# Patient Record
Sex: Female | Born: 1970 | Race: White | Hispanic: No | Marital: Single | Smoking: Current every day smoker
Health system: Southern US, Community
[De-identification: ages and names within clinical notes are randomized; demographics above are authoritative.]

## PROBLEM LIST (undated history)

## (undated) DIAGNOSIS — I10 Essential (primary) hypertension: Secondary | ICD-10-CM

## (undated) DIAGNOSIS — G43909 Migraine, unspecified, not intractable, without status migrainosus: Secondary | ICD-10-CM

## (undated) DIAGNOSIS — R51 Headache: Secondary | ICD-10-CM

## (undated) DIAGNOSIS — R519 Headache, unspecified: Secondary | ICD-10-CM

## (undated) DIAGNOSIS — IMO0001 Reserved for inherently not codable concepts without codable children: Secondary | ICD-10-CM

## (undated) DIAGNOSIS — K000 Anodontia: Secondary | ICD-10-CM

## (undated) DIAGNOSIS — K08109 Complete loss of teeth, unspecified cause, unspecified class: Secondary | ICD-10-CM

## (undated) DIAGNOSIS — D649 Anemia, unspecified: Secondary | ICD-10-CM

## (undated) HISTORY — PX: HYSTERECTOMY: SHX81

## (undated) HISTORY — PX: TUBAL LIGATION: SHX77

## (undated) HISTORY — PX: HX HYSTERECTOMY: SHX81

## (undated) HISTORY — DX: Essential (primary) hypertension: I10

---

## 1992-05-18 ENCOUNTER — Emergency Department: Admit: 1992-05-18 | Disposition: A | Discharge status: Home / Self Care | Payer: Self-pay | Source: Ambulatory Visit

## 1999-12-11 ENCOUNTER — Emergency Department (HOSPITAL_COMMUNITY): Admission: EM | Admit: 1999-12-11 | Discharge: 1999-12-11 | Payer: Self-pay | Admitting: Emergency Medicine

## 1999-12-11 ENCOUNTER — Encounter: Payer: Self-pay | Admitting: Emergency Medicine

## 2000-06-04 ENCOUNTER — Encounter: Payer: Self-pay | Admitting: *Deleted

## 2000-06-04 ENCOUNTER — Emergency Department (HOSPITAL_COMMUNITY): Admission: EM | Admit: 2000-06-04 | Discharge: 2000-06-04 | Payer: Self-pay | Admitting: Emergency Medicine

## 2004-01-13 ENCOUNTER — Emergency Department (HOSPITAL_COMMUNITY): Admission: EM | Admit: 2004-01-13 | Discharge: 2004-01-13 | Payer: Self-pay | Admitting: Emergency Medicine

## 2006-12-24 ENCOUNTER — Ambulatory Visit: Payer: Self-pay | Admitting: Sports Medicine

## 2006-12-24 ENCOUNTER — Encounter (INDEPENDENT_AMBULATORY_CARE_PROVIDER_SITE_OTHER): Payer: Self-pay | Admitting: Family Medicine

## 2006-12-24 LAB — CONVERTED CEMR LAB
ALT: <8 units/L (ref 0–35)
AST: 10 units/L (ref 0–37)
Albumin: 4.9 g/dL (ref 3.5–5.2)
Alkaline Phosphatase: 57 units/L (ref 39–117)
BUN: 11 mg/dL (ref 6–23)
CO2: 26 meq/L (ref 19–32)
Calcium: 9.5 mg/dL (ref 8.4–10.5)
Chloride: 104 meq/L (ref 96–112)
Cholesterol: 175 mg/dL (ref 0–200)
Creatinine, Ser: 0.83 mg/dL (ref 0.40–1.20)
Glucose, Bld: 84 mg/dL (ref 70–99)
HCT: 37 % (ref 36.0–46.0)
HDL: 35 mg/dL — ABNORMAL LOW (ref >39)
Hemoglobin: 11.3 g/dL — ABNORMAL LOW (ref 12.0–15.0)
LDL Cholesterol: 118 mg/dL — ABNORMAL HIGH (ref 0–99)
MCHC: 30.5 g/dL (ref 30.0–36.0)
MCV: 79.1 fL (ref 78.0–100.0)
Platelets: 322 10*3/uL (ref 150–400)
Potassium: 3.8 meq/L (ref 3.5–5.3)
RBC: 4.68 M/uL (ref 3.87–5.11)
RDW: 16.6 % — ABNORMAL HIGH (ref 11.5–14.0)
Sodium: 140 meq/L (ref 135–145)
TSH: 2.434 microintl units/mL (ref 0.350–5.50)
Total Bilirubin: 0.3 mg/dL (ref 0.3–1.2)
Total CHOL/HDL Ratio: 5
Total Protein: 7.8 g/dL (ref 6.0–8.3)
Triglycerides: 110 mg/dL (ref <150)
VLDL: 22 mg/dL (ref 0–40)
WBC: 8.8 10*3/uL (ref 4.0–10.5)

## 2007-02-11 DIAGNOSIS — I10 Essential (primary) hypertension: Secondary | ICD-10-CM | POA: Insufficient documentation

## 2007-02-11 DIAGNOSIS — G43909 Migraine, unspecified, not intractable, without status migrainosus: Secondary | ICD-10-CM | POA: Insufficient documentation

## 2007-02-11 DIAGNOSIS — F329 Major depressive disorder, single episode, unspecified: Secondary | ICD-10-CM

## 2008-07-19 ENCOUNTER — Ambulatory Visit: Payer: Self-pay | Admitting: Family Medicine

## 2008-08-01 ENCOUNTER — Encounter (INDEPENDENT_AMBULATORY_CARE_PROVIDER_SITE_OTHER): Payer: Self-pay | Admitting: Family Medicine

## 2008-08-01 ENCOUNTER — Ambulatory Visit: Payer: Self-pay | Admitting: Family Medicine

## 2008-08-01 DIAGNOSIS — N920 Excessive and frequent menstruation with regular cycle: Secondary | ICD-10-CM | POA: Insufficient documentation

## 2008-08-01 DIAGNOSIS — E663 Overweight: Secondary | ICD-10-CM | POA: Insufficient documentation

## 2008-08-02 ENCOUNTER — Ambulatory Visit (HOSPITAL_COMMUNITY): Admission: RE | Admit: 2008-08-02 | Discharge: 2008-08-02 | Payer: Self-pay | Admitting: Family Medicine

## 2008-08-03 ENCOUNTER — Encounter (INDEPENDENT_AMBULATORY_CARE_PROVIDER_SITE_OTHER): Payer: Self-pay | Admitting: Family Medicine

## 2008-08-03 LAB — CONVERTED CEMR LAB
ALT: <8 units/L (ref 0–35)
AST: 12 units/L (ref 0–37)
Albumin: 4.5 g/dL (ref 3.5–5.2)
Alkaline Phosphatase: 59 units/L (ref 39–117)
BUN: 7 mg/dL (ref 6–23)
CO2: 20 meq/L (ref 19–32)
Calcium: 9.2 mg/dL (ref 8.4–10.5)
Chloride: 107 meq/L (ref 96–112)
Cholesterol: 160 mg/dL (ref 0–200)
Creatinine, Ser: 0.69 mg/dL (ref 0.40–1.20)
Glucose, Bld: 92 mg/dL (ref 70–99)
HCT: 36.7 % (ref 36.0–46.0)
HDL: 36 mg/dL — ABNORMAL LOW (ref >39)
Hemoglobin: 11 g/dL — ABNORMAL LOW (ref 12.0–15.0)
LDL Cholesterol: 99 mg/dL (ref 0–99)
MCHC: 30 g/dL (ref 30.0–36.0)
MCV: 76.9 fL — ABNORMAL LOW (ref 78.0–100.0)
Platelets: 317 10*3/uL (ref 150–400)
Potassium: 4.3 meq/L (ref 3.5–5.3)
RBC: 4.77 M/uL (ref 3.87–5.11)
RDW: 17.9 % — ABNORMAL HIGH (ref 11.5–15.5)
Sodium: 140 meq/L (ref 135–145)
TSH: 2.054 microintl units/mL (ref 0.350–4.50)
Total Bilirubin: 0.3 mg/dL (ref 0.3–1.2)
Total CHOL/HDL Ratio: 4.4
Total Protein: 7.3 g/dL (ref 6.0–8.3)
Triglycerides: 127 mg/dL (ref <150)
VLDL: 25 mg/dL (ref 0–40)
WBC: 7.6 10*3/uL (ref 4.0–10.5)

## 2008-08-24 ENCOUNTER — Encounter: Payer: Self-pay | Admitting: *Deleted

## 2009-04-20 ENCOUNTER — Telehealth (INDEPENDENT_AMBULATORY_CARE_PROVIDER_SITE_OTHER): Payer: Self-pay | Admitting: Family Medicine

## 2009-04-20 ENCOUNTER — Ambulatory Visit: Payer: Self-pay | Admitting: Family Medicine

## 2009-04-20 ENCOUNTER — Encounter: Payer: Self-pay | Admitting: Family Medicine

## 2009-04-20 LAB — CONVERTED CEMR LAB
Beta hcg, urine, semiquantitative: NEGATIVE
Bilirubin Urine: NEGATIVE
Chlamydia, DNA Probe: NEGATIVE
Epithelial cells, urine: >20 /lpf
GC Probe Amp, Genital: NEGATIVE
Glucose, Urine, Semiquant: NEGATIVE
Nitrite: NEGATIVE
Protein, U semiquant: 30
Specific Gravity, Urine: >=1.03
Urobilinogen, UA: 0.2
pH: 5.5

## 2009-04-21 ENCOUNTER — Encounter: Payer: Self-pay | Admitting: Family Medicine

## 2009-04-23 ENCOUNTER — Ambulatory Visit: Payer: Self-pay | Admitting: Family Medicine

## 2009-09-22 ENCOUNTER — Encounter (INDEPENDENT_AMBULATORY_CARE_PROVIDER_SITE_OTHER): Payer: Self-pay | Admitting: *Deleted

## 2009-09-22 DIAGNOSIS — F17200 Nicotine dependence, unspecified, uncomplicated: Secondary | ICD-10-CM | POA: Insufficient documentation

## 2010-04-04 ENCOUNTER — Ambulatory Visit: Payer: Self-pay | Admitting: Family Medicine

## 2010-08-14 ENCOUNTER — Ambulatory Visit: Payer: Self-pay | Admitting: Family Medicine

## 2010-08-15 ENCOUNTER — Telehealth: Payer: Self-pay | Admitting: Family Medicine

## 2010-10-25 ENCOUNTER — Ambulatory Visit: Payer: Self-pay | Admitting: Family Medicine

## 2010-10-25 DIAGNOSIS — R000 Tachycardia, unspecified: Secondary | ICD-10-CM

## 2010-10-25 DIAGNOSIS — R0602 Shortness of breath: Secondary | ICD-10-CM | POA: Insufficient documentation

## 2011-01-16 NOTE — Assessment & Plan Note (Signed)
Summary: poison ivy,df   Vital Signs:  Patient profile:   40 year old female Height:      68.5 inches Weight:      178 pounds BMI:     26.77 Temp:     98.7 degrees F oral Pulse rate:   80 / minute BP sitting:   169 / 108  (left arm) Cuff size:   regular  Vitals Entered By: Garen Grams LPN (August 14, 2010 4:05 PM) CC: poison ivy x 4 days Is Patient Diabetic? No Pain Assessment Patient in pain? no        Primary Care Provider:  Lupita Raider MD  CC:  poison ivy x 4 days.  History of Present Illness: Patient seen today as acute visit, along with [facility] medical student Darra Lis.  She presents today with complaint of itchy rash that developed in the past 3 days.  Was working in yard with heavy poison ivy overgrowth since Aug 26th.  Itching started on L leg/arm, has since spread to her R side as well.  Can't stand it, making her miserable.  No fevers or chills, has had poor appetite.   Has anxiety at baseline, and this itch is aggravating even more.   Also complains of R ankle pain, since tripping over cinderblock on July 4th.  Was able to walk after the fall.  Since then has had significant pain when she touches it.  Can move ankle, but is concerned about the fact that this is not improving at almost 2 months since the injury.    Habits & Providers  Alcohol-Tobacco-Diet     Tobacco Status: current     Tobacco Counseling: to quit use of tobacco products  Allergies (verified): No Known Drug Allergies  Physical Exam  General:  Generally well appearing, no apparent distress Extremities:  Tattoo over L lateral ankle.  Underlying mild erythema without evidence of break in skin.  Full active and passive ROM, no pain to palpate malleoli.  Mild calor at site of erythema. Palpable dp pulses in both feet. Mild lateral L ankle edema noted.   R ankle with point tenderness and hyperpigmentation along medial ankle just proximal to the R medial malleolus.  Full active and passive  ROM. Palpable dp pulse.  Sensation in toes in both feet full and grossly intact.  Walks without limitation.  Skin:  Several patches of linear raised vesicular lesions, some excoriated and in varying stages of progression.  Located along L leg, R leg/thigh, L arm.     Impression & Recommendations:  Problem # 1:  POISON IVY DERMATITIS (ICD-692.6) Patient is quite miserable with the poison ivy dermatitis.  Cannot tolerate calamine lotion (makes her "break out").  Appropriate for Depo Medrol, plus oral prednisone over seven days.  May consider prolonging course if rebound pruritus.  Antihistamine for itch.  Clothes that were worn at time of contact are soaking. Her updated medication list for this problem includes:    Claritin 10 Mg Tabs (Loratadine) .Marland Kitchen... 1 tablet by mouth daily for 2 weeks    Prednisone 20 Mg Tabs (Prednisone) ..... Sig: take 2 tablets by mouth one time daily for 7 days.  Orders: Depo- Medrol 40mg  (J1030) [facility]- Est Level  3 ([address]109)  Problem # 2:  ANKLE PAIN, RIGHT (ICD-719.47) R ankle pain for nearly two months, since sustaining trauma on July 4th.  Notable point tenderness, although she is able to bear weight and ambulate without limitation, and has been able to do so  since the injury by report today.   Will image with x-ray R ankle/distal tibia in light of duration of pain and point tenderness.   Orders: Diagnostic X-Ray/Fluoroscopy (Diagnostic X-Ray/Flu) [facility]- Est Level  3 (95621)  Complete Medication List: 1)  Midrin 325-65-100 Mg Caps (Apap-isometheptene-dichloral) .... 2 capsules at onset of headache then 1 capsule every 4 hours as needed thereafter 2)  Claritin 10 Mg Tabs (Loratadine) .Marland Kitchen.. 1 tablet by mouth daily for 2 weeks 3)  Metoprolol Tartrate 25 Mg Tabs (Metoprolol tartrate) .Marland Kitchen.. 1 tab by mouth two times a day for high blood pressure 4)  Meloxicam 15 Mg Tabs (Meloxicam) .... Take one tablet daily for pain 5)  Prednisone 20 Mg Tabs (Prednisone) .... Sig: take 2  tablets by mouth one time daily for 7 days.  Patient Instructions: 1)  It was a pleasure to see you today.  We are giving you an injection of a steroid (Depo Medrol) for your intense itching from the poison ivy. 2)  You may take prednisone by mouth (40mg  one time daily) for the coming seven days.  The prescription was sent to your [facility] at [facility] and [address].  3)  You may also take Claritin (loratadine) 10mg  one time daily for the itch.  4)  We are ordering an x-ray of your R ankle, because of the duration of time you have had the pain.  5)  Please follow up with your primary doctor. Prescriptions: PREDNISONE 20 MG TABS (PREDNISONE) SIG: take 2 tablets by mouth one time daily for 7 days.  #14 x 0   Entered and Authorized by:   Paula Compton MD   Signed by:   Paula Compton MD on 08/14/2010   Method used:   Electronically to        [facility] #[id number]* (retail)       [address] [place], [place]  [address]       Ph: [phone]       Fax: [phone]   RxID:   [id number]    Medication Administration  Injection # 1:    Medication: Depo- Medrol 40mg     Diagnosis: POISON IVY DERMATITIS (ICD-692.6)    Route: IM    Site: RUOQ gluteus    Exp Date: 08/16/2011    Lot #: [id number]    Patient tolerated injection without complications    Given by: Garen Grams LPN (August 14, 2010 4:33 PM)  Orders Added: 1)  Diagnostic X-Ray/Fluoroscopy [Diagnostic X-Ray/Flu] 2)  Depo- Medrol 40mg  [J1030] 3)  [facility]- Est Level  3 [75643]

## 2011-01-16 NOTE — Assessment & Plan Note (Signed)
Summary: fall on  buttocks,tcb   Vital Signs:  Patient profile:   40 year old female Height:      68.5 inches Weight:      187 pounds BMI:     28.12 BSA:     2.00 Temp:     98.7 degrees F Pulse rate:   76 / minute BP sitting:   198 / 141  Vitals Entered By: Jone Baseman CMA (April 04, 2010 9:15 AM) CC: fall this am Pain Assessment Patient in pain? yes     Location: right hip Intensity: 10   Primary Care Provider:  Lupita Raider MD  CC:  fall this am.  History of Present Illness: 40 yo seen for work-in apt for fall at 3:30 this am  Was getting up to go to bathroom and fell down 4 steps on her right buttocks area.  has been very sore and painful this morning.  No LOC, no other part of body hurt.  Mechanical fall.  Habits & Providers  Alcohol-Tobacco-Diet     Tobacco Status: current     Tobacco Counseling: to quit use of tobacco products     Cigarette Packs/Day: 1.5  Current Medications (verified): 1)  Midrin 325-65-100 Mg Caps (Apap-Isometheptene-Dichloral) .... 2 Capsules At Onset of Headache Then 1 Capsule Every 4 Hours As Needed Thereafter 2)  Claritin 10 Mg  Tabs (Loratadine) .Marland Kitchen.. 1 Tablet By Mouth Daily For 2 Weeks 3)  Metoprolol Tartrate 25 Mg Tabs (Metoprolol Tartrate) .Marland Kitchen.. 1 Tab By Mouth Two Times A Day For High Blood Pressure 4)  Meloxicam 15 Mg Tabs (Meloxicam) .... Take One Tablet Daily For Pain  Allergies (verified): No Known Drug Allergies  Social History: Packs/Day:  1.5  Review of Systems      See HPI MS:  Complains of muscle aches. Heme:  Denies abnormal bruising and bleeding.  Physical Exam  General:  Alert and oriented, pleasant, appears older than age. Msk:  right buttocks with approx 8 x 8 cm bruise with no erythema or break in skin.  Tender in this area.  No pain over lower back or over coccyx.   Impression & Recommendations:  Problem # 1:  HIP PAIN, RIGHT (ICD-719.45)  due to buttocks bruise s/p fall.  No evidence of coccyx  fracture.  WIll give high dose NSAIDS + Tylenol for pain.  Advised to return if not improving.  Given letter to be out of work for this weekend and return on Monday 04/08/10.  Her updated medication list for this problem includes:    Meloxicam 15 Mg Tabs (Meloxicam) .Marland Kitchen... Take one tablet daily for pain  Orders: [facility]- Est Level  3 (45409)  Problem # 2:  HYPERTENSION, BENIGN SYSTEMIC (ICD-401.1)  Markedly hypertensive today.  Patient not taking any meds.  In the past she has tried norvasc which was too expensive, hctz/lisinopril which made her sick (lightheaded, nauseated).  She does not think she ever filled themetoprolol that was prescribed last time. Will refill this as it may also help her headaches.  Advised follow-up with PCP.  Her updated medication list for this problem includes:    Metoprolol Tartrate 25 Mg Tabs (Metoprolol tartrate) .Marland Kitchen... 1 tab by mouth two times a day for high blood pressure  BP today: 198/141 Prior BP: 138/94 (04/23/2009)  Labs Reviewed: K+: 4.3 (08/01/2008) Creat: : 0.69 (08/01/2008)   Chol: 160 (08/01/2008)   HDL: 36 (08/01/2008)   LDL: 99 (08/01/2008)   TG: 127 (08/01/2008)  Orders: [facility]- Est Level  3 (99213)  Complete Medication List: 1)  Midrin 325-65-100 Mg Caps (Apap-isometheptene-dichloral) .... 2 capsules at onset of headache then 1 capsule every 4 hours as needed thereafter 2)  Claritin 10 Mg Tabs (Loratadine) .Marland Kitchen.. 1 tablet by mouth daily for 2 weeks 3)  Metoprolol Tartrate 25 Mg Tabs (Metoprolol tartrate) .Marland Kitchen.. 1 tab by mouth two times a day for high blood pressure 4)  Meloxicam 15 Mg Tabs (Meloxicam) .... Take one tablet daily for pain  Patient Instructions: 1)  Return for recheck if your pain is not improving. 2)  Make apopintment to see your primary MD to work on your blood pressure and other concerns. Prescriptions: MELOXICAM 15 MG TABS (MELOXICAM) take one tablet daily for pain  #14 x 0   Entered and Authorized by:   Delbert Harness MD   Signed  by:   Delbert Harness MD on 04/04/2010   Method used:   Electronically to        [facility] #[id number]* (retail)       [address] [place], [place]  [address]       Ph: [phone]       Fax: [phone]   RxID:   [id number] METOPROLOL TARTRATE 25 MG TABS (METOPROLOL TARTRATE) 1 tab by mouth two times a day for high blood pressure  #60 x 1   Entered and Authorized by:   Delbert Harness MD   Signed by:   Delbert Harness MD on 04/04/2010   Method used:   Electronically to        [facility] [address] #[id number]* (retail)       [address] [place], [place]  [address]       Ph: [phone]       Fax: [phone]   RxID:   [id number]    Prevention & Chronic Care Immunizations   Influenza vaccine: Not documented    Tetanus booster: Not documented    Pneumococcal vaccine: Not documented  Other Screening   Pap smear: Not documented   Smoking status: current  (04/04/2010)   Smoking cessation counseling: yes  (08/01/2008)  Lipids   Total Cholesterol: 160  (08/01/2008)   LDL: 99  (08/01/2008)   LDL Direct: Not documented   HDL: 36  (08/01/2008)   Triglycerides: 127  (08/01/2008)  Hypertension   Last Blood Pressure: 198 / 141  (04/04/2010)   Serum creatinine: 0.69  (08/01/2008)   Serum potassium 4.3  (08/01/2008)    Hypertension flowsheet reviewed?: Yes   Progress toward BP goal: Deteriorated  Self-Management Support :    Hypertension self-management support: Not documented

## 2011-01-16 NOTE — Progress Notes (Signed)
   Called patient at [phone] to inquire about patient welfare; she reports that she continues to itch but it is better than yesterday.  The R ankle is still painful to touch, plans to go for x-ray tomorrow (Sept 2nd).  Will call her back with result when available.  Paula Compton MD  August 15, 2010 8:52 AM

## 2011-01-16 NOTE — Assessment & Plan Note (Signed)
Summary: SOB,df     Vital Signs:  Patient profile:   40 year old female Height:      68.5 inches Weight:      180.6 pounds BMI:     27.16 O2 Sat:      100 % on Room air Temp:     98.2 degrees F oral Pulse rate:   69 / minute BP sitting:   162 / 104  (left arm) Cuff size:   regular  Vitals Entered By: Garen Grams LPN (October 25, 2010 9:47 AM)  O2 Flow:  Room air CC: SOB x 1-2 years Is Patient Diabetic? No Pain Assessment Patient in pain? no        Primary Care Provider:  . [facility]  CC:  SOB x 1-2 years.  History of Present Illness: 1) Dyspnea, fast heartbeat: Patient reports dyspnea on exertion and at rest for at least the past 2 years, worse over past few months. Reports dyspnea with climbing flight of stairs, but also reports dyspnea while sitting down and while sleeping. Also reports sensation of "heart beating fast" - but does not skip beats - once or twice a day for past year. Unable to say how long episodes last. Reports chest tightness associated with these episodes as well as "thumping in her neck". Reports some dizziness when these episodes occur but denies syncope. Reports generalized weakness with these episodes. Reports poor sleep, increased stress over past few months, "feels cold all the time". Denies leg swelling, substernal chest pain, radiating chest pain, nausea, emesis, diaphoresis.   2) Tobacco use: 1-2 packs per day. Wants to quit. Has tried patches and gum without success. Interested in cessation class. Smokes when stressed. Wants to try Chantix. Reports chronic cough with clear / grey sputum unchanged from baseline with regards to volume or frequency.   3) Medication review: Has not been seen for follow up of chronic issues since May 2010. Has not taken any medications or asked for refills in past year at least.   Habits & Providers  Alcohol-Tobacco-Diet     Tobacco Status: current     Tobacco Counseling: to quit use of tobacco products  Cigarette Packs/Day: 1.5  Current Medications (verified): 1)  None  Allergies (verified): No Known Drug Allergies  Family History: Reviewed history from 02/11/2007 and no changes required. Father with multiple MI in 94s, DM, HTN, HLD, Grandfather with B BKA 2/2 PVD/DM, Mother with multiple TIAs in 25s, Son with some type of familiar hyperlipidemia (TGs of 2300)  Physical Exam  General:  Generally well appearing, no apparent distress, no phrase dyspnea - though she reports "I can't breathe right now" - reviewed vitals  Mouth:  moist membranes, no erythema or exudate, no oral lesions or masses Neck:  no thyromegaly or JVD  Lungs:  Normal respiratory effort, chest expands symmetrically. Lungs are clear to auscultation, no crackles or wheezes. Heart:  Normal rate and regular rhythm. S1 and S2 normal without gallop, murmur, click, rub or other extra sounds. Abdomen:  soft, non-tender, normal bowel sounds, and no masses.   Pulses:  2+ radials regular rate  Extremities:  no edema  Neurologic:  alert & oriented X3 and cranial nerves II-XII intact.   Skin:  warm and dry  Psych:  Oriented X3 and memory intact for recent and remote.  Becomes highly upset when told that we would have to schedule her for PFTs this week.    Impression & Recommendations:  Problem # 1:  DYSPNEA (  ICD-786.05) Assessment New  Chronic issue - at least 1 year in duration. Normal exam and vitals, including 100% O2 satuation on room air at rest (patient refused ambulatory O2 sats). Uncertain etiology. Possibly related to smoking history vs likely COPD vs cardiac disease (multiple risk factors including HTN, tobacco use, family history of early MI) vs other. Advised that I would need to schedule her for PFTs next week - she became upset at this and stated that she wanted the testing "done today" because she is "short of breath right now" (throughout our conversation, patient was not tachypneic, not in distress, not using  accessory muscles and able to complete multiple sentences without apparent difficulty). Advised patient that given normal O2 sats, normal exam and chronicity of dyspnea (without baseline change for at least past few months) that we did not need to do testing today but would get it scheduled for next week - at this point she got up and walked out of the office.   Orders: [facility]- Est  Level 4 ([address]214)  Problem # 2:  TACHYCARDIA (ICD-785.0) Assessment: New  Uncertain etiology. Chronic issue. Not tachycardic today though reports that "my heart is beating fast right now". Advised that I would like to proceed with EKG, Holter monitor, preliminary lab work including TSH, CMET, CBC (and A1C, FLP given risk factors) today and obtain PFTs as above next week. Patient became highly upset and walked out of the office, stating that I "did not want to do anything to help [her]". If other [facility] physician sees her in the future for this issue would obtain these tests.  Orders: [facility]- Est  Level 4 ([address])  Problem # 3:  TOBACCO USER (ICD-305.1) Assessment: Unchanged  Contemplative about quitting. Unable to address this issue further due to patient walking out of office. Would have physician follow up on this in the future.   Orders: [facility]- Est  Level 4 (99214)   Orders Added: 1)  [facility]- Est  Level 4 [[address]]

## 2011-07-24 ENCOUNTER — Encounter: Payer: Self-pay | Admitting: Family Medicine

## 2011-07-24 ENCOUNTER — Ambulatory Visit (INDEPENDENT_AMBULATORY_CARE_PROVIDER_SITE_OTHER): Payer: Self-pay | Admitting: Family Medicine

## 2011-07-24 DIAGNOSIS — F17200 Nicotine dependence, unspecified, uncomplicated: Secondary | ICD-10-CM

## 2011-07-24 DIAGNOSIS — I10 Essential (primary) hypertension: Secondary | ICD-10-CM

## 2011-07-24 DIAGNOSIS — F329 Major depressive disorder, single episode, unspecified: Secondary | ICD-10-CM

## 2011-07-24 LAB — BASIC METABOLIC PANEL
BUN: 8 mg/dL (ref 6–23)
Chloride: 105 mEq/L (ref 96–112)
Creat: 0.81 mg/dL (ref 0.50–1.10)
Potassium: 3.8 mEq/L (ref 3.5–5.3)

## 2011-07-24 MED ORDER — CITALOPRAM HYDROBROMIDE 20 MG PO TABS: 20.0000 mg | ORAL_TABLET | Freq: Every day | ORAL | Status: DC

## 2011-07-24 MED ORDER — HYDROCHLOROTHIAZIDE 25 MG PO TABS: 25.0000 mg | ORAL_TABLET | Freq: Every day | ORAL | Status: DC

## 2011-07-24 NOTE — Progress Notes (Signed)
  Subjective:    Patient ID: Krista Foster, female    DOB: 1971-11-06, 40 y.o.   MRN: [id number]  HPI 1. Hypertension Blood pressure at home: not checking Blood pressure today: 187/115 Taking Meds:no.  Pt has not been seen for quite some time and is starting a new job.  Pt states her blood pressure was extremely elevated at her physical and they told her she would need to be seen here first before she could start work.  Pt used to be on a combo medication but cannot  Remember the name of it.  Pt would be willing to start a medication if needed.  Side effects: ROS: Deniesvisual changes nausea, vomiting, chest pain or abdominal pain or shortness of breath but + for headaches from time to time.    2.  Tobacco abuse.  Smoking about 1 ppd and really wants to quit,, has tried before but never quit for any real length of time.  Pt states every time she has quit she has tried to stop cold [place] and did not seem to help.   3.  Pt states she does have some depression and anxiety issues as well that stop her from doing some activities.  Pt denies  Suicidal and Homicidal ideation  But states her feelings does stop her from doing her regular activities like going shopping.  Pt was on Zoloft before and really did not like how it made her feel.  Pt though is will to see if anything else would help.  Pt though does not have insurance at this time and would like to keep workup chap if possible.  Once pt starts her job will have insurance     Review of Systems Denies fever, chills, nausea vomiting abdominal pain, dysuria, chest pain, shortness of breath dyspnea on exertion or numbness in extremities Past medical history, social, surgical and family history all reviewed.      Objective:   Physical Exam  Constitutional: She is oriented to person, place, and time. She appears well-developed and well-nourished.  Eyes: Pupils are equal, round, and reactive to light.  Neck: Normal range of motion. Neck supple.    Cardiovascular: Normal rate, regular rhythm and normal heart sounds.   Pulmonary/Chest: Effort normal and breath sounds normal.  Abdominal: Soft. Bowel sounds are normal. There is no tenderness.  Musculoskeletal: Normal range of motion.  Neurological: She is alert and oriented to person, place, and time. She has normal reflexes.          Assessment & Plan:

## 2011-07-24 NOTE — Patient Instructions (Signed)
Nice to meet you I am giving you a medicine for your blood pressure.  Take 1 pill daily, you will pee a little more I am giving you a medicine for your mood.  Take 1 pill daily until you see me.   I want you to come back in 1-2 weeks and we will recheck your blood pressure.

## 2011-07-25 NOTE — Assessment & Plan Note (Signed)
Still smoking, will have pt cut down by 1 cigarette daily until the next time I see her which means she should be around 1/2 ppd then will discuss options at that time. Information handed out to pt on quit line.

## 2011-07-25 NOTE — Assessment & Plan Note (Signed)
Has underlying anxiety disorder as well, will treat with medicine on the four dollar plan like celexa.  Told pt not to expect much change in the first month but would monitor and likely will need to increase dose in about 2-3 weeks when pt returns.

## 2011-07-25 NOTE — Assessment & Plan Note (Signed)
Not under control at present moment.  Pt will be checked for end organ damage.  Will start on HCTZ 25 and will have pt follow up.  Warned of potential side effects and will see how pt does. RTC in 2-3 weeks.

## 2011-08-08 ENCOUNTER — Ambulatory Visit: Payer: Self-pay | Admitting: Family Medicine

## 2011-08-08 ENCOUNTER — Ambulatory Visit (INDEPENDENT_AMBULATORY_CARE_PROVIDER_SITE_OTHER): Payer: Self-pay | Admitting: Family Medicine

## 2011-08-08 ENCOUNTER — Encounter: Payer: Self-pay | Admitting: Family Medicine

## 2011-08-08 DIAGNOSIS — I10 Essential (primary) hypertension: Secondary | ICD-10-CM

## 2011-08-08 DIAGNOSIS — F329 Major depressive disorder, single episode, unspecified: Secondary | ICD-10-CM

## 2011-08-08 DIAGNOSIS — F17200 Nicotine dependence, unspecified, uncomplicated: Secondary | ICD-10-CM

## 2011-08-08 MED ORDER — FLUOXETINE HCL 20 MG PO TABS: 20.0000 mg | ORAL_TABLET | Freq: Every day | ORAL | Status: DC

## 2011-08-08 MED ORDER — LISINOPRIL-HYDROCHLOROTHIAZIDE 20-25 MG PO TABS: 1.0000 | ORAL_TABLET | Freq: Every day | ORAL | Status: DC

## 2011-08-08 NOTE — Assessment & Plan Note (Signed)
Still have room to improve, will add lisinopril, BMET in 1 week see again in 3 weeks.

## 2011-08-08 NOTE — Patient Instructions (Signed)
Good to see you We will change your medicine around.  Your blood pressure medicine now has 2 medicines in it.  I want you to take 1 pill daily.  We will change your mood pill to to make sure you don't have that feeling Congratulations on cutting down on smoking, you should be really proud!  Keep it up! I want you to come back in a week for labs only I want to see you again in 2-4 weeks.

## 2011-08-09 NOTE — Assessment & Plan Note (Addendum)
Change to fluoxitine see if a little less energizing, still wanted to keep medicine cheap if does not work then would look to see if lexapro would be of benefit or try effexor with pt wanting to keep weight neutral to lose weight. See again within 1 month

## 2011-08-09 NOTE — Progress Notes (Signed)
  Subjective:    Patient ID: Krista Foster, female    DOB: [birth year], 40 y.o.   MRN: [id number]  HPI 1. Hypertension Blood pressure at home:170 SBP Blood pressure today: 150/100 Taking Meds:yes HCTZ 25 started about 3 weeks ago Side effects:some headache and otherwise doing well.  ROS: Denies visual changes nausea, vomiting, chest pain or abdominal pain or shortness of breath.  2.  Depression/Anxiety-  Pt does not like how celexa makes her feel which is jittery and on edge most of the time.  She also states that she feels in the morning her body feels like she needs it. Pt denies  Suicidal and Homicidal ideation  And she does notice her family says she is able to deal with stuff better but would like to try something else.  3.  Pt has cut down smoking to about 1/2 ppd.  Pt is trying to continue to get it down, knowing it is not good for he child who always come first. Pt would like to quit but does not want gum or patches yet.   Review of Systems    Past medical history, social, surgical and family history all reviewed.   Objective:   Physical Exam Constitutional: She is oriented to person, place, and time. She appears well-developed and well-nourished.  Eyes: Pupils are equal, round, and reactive to light.  Neck: Normal range of motion. Neck supple.  Cardiovascular: Normal rate, regular rhythm and normal heart sounds.   Pulmonary/Chest: Effort normal and breath sounds normal.  Abdominal: Soft. Bowel sounds are normal. There is no tenderness.  Musculoskeletal: Normal range of motion.  Neurological: She is alert and oriented to person, place, and time. She has normal reflexes.        Assessment & Plan:

## 2011-08-09 NOTE — Assessment & Plan Note (Signed)
Improved, encouraged pt to continue to try to decrease smoking next goal is to be at about 1/4 ppd in 1 month

## 2011-09-05 ENCOUNTER — Ambulatory Visit (INDEPENDENT_AMBULATORY_CARE_PROVIDER_SITE_OTHER): Payer: Self-pay | Admitting: Family Medicine

## 2011-09-05 ENCOUNTER — Encounter: Payer: Self-pay | Admitting: Family Medicine

## 2011-09-05 VITALS — BP 119/75 | HR 76 | Temp 98.0°F | Wt 180.0 lb

## 2011-09-05 DIAGNOSIS — R5383 Other fatigue: Secondary | ICD-10-CM | POA: Insufficient documentation

## 2011-09-05 DIAGNOSIS — Z23 Encounter for immunization: Secondary | ICD-10-CM

## 2011-09-05 DIAGNOSIS — R5381 Other malaise: Secondary | ICD-10-CM

## 2011-09-05 DIAGNOSIS — I10 Essential (primary) hypertension: Secondary | ICD-10-CM

## 2011-09-05 DIAGNOSIS — F329 Major depressive disorder, single episode, unspecified: Secondary | ICD-10-CM

## 2011-09-05 LAB — TSH: TSH: 0.981 u[IU]/mL (ref 0.350–4.500)

## 2011-09-05 MED ORDER — CITALOPRAM HYDROBROMIDE 40 MG PO TABS: 40.0000 mg | ORAL_TABLET | Freq: Every day | ORAL | Status: DC

## 2011-09-05 MED ORDER — LISINOPRIL-HYDROCHLOROTHIAZIDE 20-25 MG PO TABS: 1.0000 | ORAL_TABLET | Freq: Every day | ORAL | Status: DC

## 2011-09-05 NOTE — Assessment & Plan Note (Signed)
Patient though is at goal at this time but is stating that she's having some orthostatic hypotension type syndromes. We will have her medication and see if patient can continue to improve discussed weight loss as well as nutrition options to try to help her decrease her blood rupture. We'll have patient followup in 6 weeks for reevaluation at this time will get a basic metabolic panel to check kidney function with patient being on an ACE inhibitior

## 2011-09-05 NOTE — Assessment & Plan Note (Signed)
Patient's fatigue as well as family history of thyroid disease we will check a TSH to see if this could be contributing to it or is this all secondary to her depression.

## 2011-09-05 NOTE — Assessment & Plan Note (Signed)
Patient seemed to be doing better at the 20 mg of Celexa and is able to afford medication. She recently no feels like she's plateaued so at this time we'll increase her to 40 mg to take the medicine with food which will hopefully help the abdominal pain and followup in approximately 6 weeks' time to see if there is any improvement patient given red flags again and when to seek medical attention

## 2011-09-05 NOTE — Patient Instructions (Addendum)
Good to see you We will get some labs today Take 1/2 of your blood pressure medicine Increase your celexa to 40 mg daily.  I want to see you again in 6-8 weeks and we probably should do your pap smear.

## 2011-09-05 NOTE — Progress Notes (Signed)
  Subjective:    Patient ID: Krista Foster, female    DOB: 1970-12-19, 40 y.o.   MRN: [id number]  HPI Depression: Patient is here for followup. Patient was supposed to switch to Prozac but unfortunately she states that the price is too expensive so we refilled her Celexa. Patient states though that she did notice an initial improvement and so did her kids. Patient though feels that she has plateaued on this medication still having some types of bouts of depression denies any type of suicidal or homicidal ideation. Patient states that the medicine seems to upset her stomach but she does take it on an empty stomach and does not eat for 2 hours until afterwards. Patient though has been more interactive with her kids recently patient though declines went to talk to anybody at this time. Patient though has been feeling a little more fatigued than usual.    Hypertension Blood pressure at home:not checking Blood pressure today: 119/75 Taking Meds:yes Side effects: States medication does give her abdominal pain but does take it on empty stomach as well as she's been noticing when she changes positions she becomes lightheaded and has black spots in her vision. Denies any type of loss of consciousness or falls. ROS: Denies headache  nausea, vomiting, chest pain or abdominal pain or shortness of breath.  Review of Systems See above in history of present illness Past medical history, social, surgical and family history all reviewed.     Objective:   Physical Exam Constitutional: She is oriented to person, place, and time. She appears well-developed and well-nourished.   Neck: Normal range of motion. Neck supple.  Cardiovascular: Normal rate, regular rhythm and normal heart sounds.   Pulmonary/Chest: Effort normal and breath sounds normal.  Abdominal: Soft. Bowel sounds are normal. There is no tenderness.  Musculoskeletal: Normal range of motion.  Neurological: She is alert and oriented to person, place,  and time. She has normal reflexes.    Assessment & Plan:

## 2011-10-09 ENCOUNTER — Ambulatory Visit: Payer: Self-pay | Admitting: Family Medicine

## 2012-01-22 ENCOUNTER — Encounter: Payer: Self-pay | Admitting: Family Medicine

## 2012-01-22 ENCOUNTER — Ambulatory Visit (INDEPENDENT_AMBULATORY_CARE_PROVIDER_SITE_OTHER): Payer: BC Managed Care – PPO | Admitting: Family Medicine

## 2012-01-22 DIAGNOSIS — R112 Nausea with vomiting, unspecified: Secondary | ICD-10-CM

## 2012-01-22 DIAGNOSIS — I10 Essential (primary) hypertension: Secondary | ICD-10-CM

## 2012-01-22 DIAGNOSIS — F329 Major depressive disorder, single episode, unspecified: Secondary | ICD-10-CM

## 2012-01-22 LAB — COMPREHENSIVE METABOLIC PANEL
ALT: <8 U/L (ref 0–35)
Albumin: 4.6 g/dL (ref 3.5–5.2)
Alkaline Phosphatase: 52 U/L (ref 39–117)
Glucose, Bld: 86 mg/dL (ref 70–99)
Potassium: 3.8 mEq/L (ref 3.5–5.3)
Sodium: 135 mEq/L (ref 135–145)
Total Bilirubin: 0.4 mg/dL (ref 0.3–1.2)
Total Protein: 7.2 g/dL (ref 6.0–8.3)

## 2012-01-22 LAB — LIPID PANEL
LDL Cholesterol: 106 mg/dL — ABNORMAL HIGH (ref 0–99)
Triglycerides: 227 mg/dL — ABNORMAL HIGH (ref <150)
VLDL: 45 mg/dL — ABNORMAL HIGH (ref 0–40)

## 2012-01-22 LAB — CBC WITH DIFFERENTIAL/PLATELET
Basophils Absolute: 0 10*3/uL (ref 0.0–0.1)
HCT: 38 % (ref 36.0–46.0)
Hemoglobin: 12 g/dL (ref 12.0–15.0)
Lymphocytes Relative: 30 % (ref 12–46)
Monocytes Absolute: 0.6 10*3/uL (ref 0.1–1.0)
Monocytes Relative: 7 % (ref 3–12)
Neutro Abs: 5 10*3/uL (ref 1.7–7.7)
WBC: 8.2 10*3/uL (ref 4.0–10.5)

## 2012-01-22 LAB — TSH: TSH: 0.802 u[IU]/mL (ref 0.350–4.500)

## 2012-01-22 MED ORDER — OMEPRAZOLE 20 MG PO CPDR: 20.0000 mg | DELAYED_RELEASE_CAPSULE | Freq: Every day | ORAL | Status: DC

## 2012-01-22 MED ORDER — LISINOPRIL-HYDROCHLOROTHIAZIDE 20-25 MG PO TABS: 0.5000 | ORAL_TABLET | Freq: Every day | ORAL | Status: DC

## 2012-01-22 NOTE — Patient Instructions (Addendum)
We'll get some labs today. Also I when she to decrease your blood pressure medicine to half tab daily. I'm going to give you a medicine for heartburn to see if it helps. I when she to come back with the appointment above to see if you're getting any better.

## 2012-01-22 NOTE — Assessment & Plan Note (Signed)
Likely secondary to orthostatic hypotension. We'll decrease her blood pressure medication to half a pill daily told her to hold it for the next 2 days then start at half a pill daily. At this time we'll get labs as well to rule out any infectious etiology. No imaging is necessary will followup closely.

## 2012-01-22 NOTE — Progress Notes (Signed)
  Subjective:    Patient ID: Krista Foster, female    DOB: 01-Oct-1971, 41 y.o.   MRN: [id number]  HPI 41 year old female coming in with 3 week history of intermittent nausea vomiting and dizziness. Patient states that she has not been sick states that this can come on with or without food and occurs multiple times a day. Patient has not associated with change in position but doesn't know if it's not do to change in position either. Patient denies any palpitations chest pain or shortness of breath during these episodes. Patient has not been checking her blood pressure during this time but has been taking her medication. Patient has stopped taking her Celexa but that was approximately one month ago because she is having trouble concentrating. Patient states that she does not feel depressed at this time just ill sometimes. Patient aware tested do a lot of bending and lifting and sometimes with bending she seems to have back nausea followed by dizziness.   Review of Systems As stated above    Objective:   Physical Exam Vitals reviewed with patient actually failing orthostatics. Gen. no apparent distress Cardiovascular: Tachycardia and regular rhythm no murmur Pulmonary: Clear to auscultation bilaterally Extremities nontender 2+ pulses DTRs intact no edema Skin: No rash    Assessment & Plan:

## 2012-01-23 ENCOUNTER — Telehealth: Payer: Self-pay | Admitting: Family Medicine

## 2012-01-23 NOTE — Telephone Encounter (Signed)
No called patient back told her that the front.

## 2012-01-23 NOTE — Telephone Encounter (Signed)
Patient requesting out of work note from yesterday thru today.  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  Please call when ready to pick up

## 2012-01-23 NOTE — Telephone Encounter (Signed)
Will forward to MD seen yesterday 

## 2012-01-27 ENCOUNTER — Encounter: Payer: Self-pay | Admitting: Family Medicine

## 2012-01-27 ENCOUNTER — Ambulatory Visit (INDEPENDENT_AMBULATORY_CARE_PROVIDER_SITE_OTHER): Payer: BC Managed Care – PPO | Admitting: Family Medicine

## 2012-01-27 DIAGNOSIS — I10 Essential (primary) hypertension: Secondary | ICD-10-CM

## 2012-01-27 DIAGNOSIS — H6690 Otitis media, unspecified, unspecified ear: Secondary | ICD-10-CM

## 2012-01-27 MED ORDER — BLACK COHOSH 160 MG PO CAPS: 2.0000 | ORAL_CAPSULE | Freq: Every day | ORAL | Status: DC

## 2012-01-27 MED ORDER — IBUPROFEN 600 MG PO TABS: 600.0000 mg | ORAL_TABLET | Freq: Three times a day (TID) | ORAL | Status: AC | PRN

## 2012-01-27 MED ORDER — AMOXICILLIN-POT CLAVULANATE 875-125 MG PO TABS: 1.0000 | ORAL_TABLET | Freq: Two times a day (BID) | ORAL | Status: AC

## 2012-01-27 NOTE — Patient Instructions (Signed)
Good to see you. I am giving you an antibiotic for your year. I when she to come back in 2 weeks and we'll do a Pap smear at that time.

## 2012-01-28 DIAGNOSIS — H6690 Otitis media, unspecified, unspecified ear: Secondary | ICD-10-CM | POA: Insufficient documentation

## 2012-01-28 NOTE — Assessment & Plan Note (Signed)
Patient elevated again today. We'll not change medical management though due to patient feeling funny.

## 2012-01-28 NOTE — Assessment & Plan Note (Signed)
Patient appears to be infected and might be giving her the dizziness feeling. Patient will be treated with antibiotics follow closely. Hopefully this will help her symptoms.

## 2012-01-28 NOTE — Progress Notes (Signed)
  Subjective:    Patient ID: Krista Foster, female    DOB: 05/28/1971, 41 y.o.   MRN: [id number]  HPI 41 year old female who is found to be orthostatic hypotension dizziness and nausea at last office visit comes back for followup. Patient states since that time she's been feeling a little better but still has these dizzy feeling but denies any nausea. Patient had decreased her blood pressure medicine to have the pill daily and states she does feel better overall. Patient though states she does not feel she is at her baseline. Patient states that potentially her right ear seems to hurt as well but denies fever Patient denies any feeling of passing out no other changes in environment or diet.  Patient is also complaining of little bit of hot flashes and this month she had one irregular period and only 2 weeks after her normal period.  Review of Systems Denies fever, chills, nausea vomiting abdominal pain, dysuria, chest pain, shortness of breath dyspnea on exertion or numbness in extremities    Objective:   Physical Exam General: No apparent distress vitals reviewed HEENT patient's right tympanic membrane is erythemic minorly bulging patient was tender on exam. Left tympanic membrane normal. Mild postnasal drip Cardio vascular: Regular rate and rhythm no murmur Pulmonary: Clear to auscultation bilaterally    Assessment & Plan:

## 2012-02-11 ENCOUNTER — Ambulatory Visit: Payer: BC Managed Care – PPO | Admitting: Family Medicine

## 2013-02-22 ENCOUNTER — Ambulatory Visit: Admit: 2013-02-22 | Disposition: A | Discharge status: Home / Self Care | Payer: Self-pay

## 2014-12-15 DIAGNOSIS — D649 Anemia, unspecified: Secondary | ICD-10-CM

## 2014-12-15 HISTORY — DX: Anemia, unspecified: D64.9

## 2014-12-20 ENCOUNTER — Encounter: Payer: Self-pay | Admitting: Family Medicine

## 2014-12-20 ENCOUNTER — Telehealth: Payer: Self-pay | Admitting: Family Medicine

## 2014-12-20 ENCOUNTER — Ambulatory Visit (INDEPENDENT_AMBULATORY_CARE_PROVIDER_SITE_OTHER): Payer: BLUE CROSS/BLUE SHIELD | Admitting: *Deleted

## 2014-12-20 ENCOUNTER — Other Ambulatory Visit (HOSPITAL_COMMUNITY)
Admission: RE | Admit: 2014-12-20 | Discharge: 2014-12-20 | Disposition: A | Discharge status: Home / Self Care | Payer: BLUE CROSS/BLUE SHIELD | Source: Ambulatory Visit | Attending: Family Medicine | Admitting: Family Medicine

## 2014-12-20 ENCOUNTER — Ambulatory Visit (INDEPENDENT_AMBULATORY_CARE_PROVIDER_SITE_OTHER): Payer: BLUE CROSS/BLUE SHIELD | Admitting: Family Medicine

## 2014-12-20 VITALS — BP 167/97 | HR 91 | Temp 98.1°F | Wt 169.0 lb

## 2014-12-20 DIAGNOSIS — Z23 Encounter for immunization: Secondary | ICD-10-CM

## 2014-12-20 DIAGNOSIS — Z1151 Encounter for screening for human papillomavirus (HPV): Secondary | ICD-10-CM | POA: Insufficient documentation

## 2014-12-20 DIAGNOSIS — N926 Irregular menstruation, unspecified: Secondary | ICD-10-CM | POA: Diagnosis not present

## 2014-12-20 DIAGNOSIS — N920 Excessive and frequent menstruation with regular cycle: Secondary | ICD-10-CM

## 2014-12-20 DIAGNOSIS — I10 Essential (primary) hypertension: Secondary | ICD-10-CM

## 2014-12-20 DIAGNOSIS — R634 Abnormal weight loss: Secondary | ICD-10-CM | POA: Diagnosis not present

## 2014-12-20 DIAGNOSIS — Z124 Encounter for screening for malignant neoplasm of cervix: Secondary | ICD-10-CM

## 2014-12-20 DIAGNOSIS — D500 Iron deficiency anemia secondary to blood loss (chronic): Secondary | ICD-10-CM | POA: Insufficient documentation

## 2014-12-20 DIAGNOSIS — Z01411 Encounter for gynecological examination (general) (routine) with abnormal findings: Secondary | ICD-10-CM | POA: Diagnosis present

## 2014-12-20 LAB — COMPREHENSIVE METABOLIC PANEL
AST: 11 U/L (ref 0–37)
Albumin: 3.9 g/dL (ref 3.5–5.2)
Alkaline Phosphatase: 51 U/L (ref 39–117)
BILIRUBIN TOTAL: 0.3 mg/dL (ref 0.2–1.2)
BUN: 8 mg/dL (ref 6–23)
CO2: 24 mEq/L (ref 19–32)
CREATININE: 0.5 mg/dL (ref 0.50–1.10)
Calcium: 9 mg/dL (ref 8.4–10.5)
Chloride: 106 mEq/L (ref 96–112)
Glucose, Bld: 88 mg/dL (ref 70–99)
Potassium: 4.5 mEq/L (ref 3.5–5.3)
Sodium: 138 mEq/L (ref 135–145)
Total Protein: 6.5 g/dL (ref 6.0–8.3)

## 2014-12-20 LAB — CBC
HEMATOCRIT: 21 % — AB (ref 36.0–46.0)
HEMOGLOBIN: 5.8 g/dL — AB (ref 12.0–15.0)
MCH: 17.2 pg — AB (ref 26.0–34.0)
MCHC: 27.6 g/dL — AB (ref 30.0–36.0)
MCV: 62.1 fL — AB (ref 78.0–100.0)
MPV: 9 fL (ref 8.6–12.4)
Platelets: 342 10*3/uL (ref 150–400)
RBC: 3.38 MIL/uL — AB (ref 3.87–5.11)
RDW: 18.7 % — AB (ref 11.5–15.5)
WBC: 7.4 10*3/uL (ref 4.0–10.5)

## 2014-12-20 LAB — TSH: TSH: 0.905 u[IU]/mL (ref 0.350–4.500)

## 2014-12-20 LAB — POCT URINE PREGNANCY: PREG TEST UR: NEGATIVE

## 2014-12-20 LAB — POCT HEMOGLOBIN: HEMOGLOBIN: 5.8 g/dL — AB (ref 12.2–16.2)

## 2014-12-20 MED ORDER — HYDROCHLOROTHIAZIDE 25 MG PO TABS: 25.0000 mg | ORAL_TABLET | Freq: Every day | ORAL | Status: DC

## 2014-12-20 MED ORDER — FERROUS SULFATE 75 (15 FE) MG/ML PO SOLN: 15.0000 mg | Freq: Two times a day (BID) | ORAL | Status: DC

## 2014-12-20 MED ORDER — MEGESTROL ACETATE 40 MG PO TABS: 80.0000 mg | ORAL_TABLET | Freq: Four times a day (QID) | ORAL | Status: DC

## 2014-12-20 NOTE — Assessment & Plan Note (Signed)
BP is in a stroke range--trial of HCTZ alone and repeat BP in 4 wks.

## 2014-12-20 NOTE — Patient Instructions (Signed)
Hypertension Hypertension, commonly called high blood pressure, is when the force of blood pumping through your arteries is too strong. Your arteries are the blood vessels that carry blood from your heart throughout your body. A blood pressure reading consists of a higher number over a lower number, such as 110/72. The higher number (systolic) is the pressure inside your arteries when your heart pumps. The lower number (diastolic) is the pressure inside your arteries when your heart relaxes. Ideally you want your blood pressure below 120/80. Hypertension forces your heart to work harder to pump blood. Your arteries may become narrow or stiff. Having hypertension puts you at risk for heart disease, stroke, and other problems.  RISK FACTORS Some risk factors for high blood pressure are controllable. Others are not.  Risk factors you cannot control include:   Race. You may be at higher risk if you are African American.  Age. Risk increases with age.  Gender. Men are at higher risk than women before age 45 years. After age 65, women are at higher risk than men. Risk factors you can control include:  Not getting enough exercise or physical activity.  Being overweight.  Getting too much fat, sugar, calories, or salt in your diet.  Drinking too much alcohol. SIGNS AND SYMPTOMS Hypertension does not usually cause signs or symptoms. Extremely high blood pressure (hypertensive crisis) may cause headache, anxiety, shortness of breath, and nosebleed. DIAGNOSIS  To check if you have hypertension, your health care provider will measure your blood pressure while you are seated, with your arm held at the level of your heart. It should be measured at least twice using the same arm. Certain conditions can cause a difference in blood pressure between your right and left arms. A blood pressure reading that is higher than normal on one occasion does not mean that you need treatment. If one blood pressure reading  is high, ask your health care provider about having it checked again. TREATMENT  Treating high blood pressure includes making lifestyle changes and possibly taking medicine. Living a healthy lifestyle can help lower high blood pressure. You may need to change some of your habits. Lifestyle changes may include:  Following the DASH diet. This diet is high in fruits, vegetables, and whole grains. It is low in salt, red meat, and added sugars.  Getting at least 2 hours of brisk physical activity every week.  Losing weight if necessary.  Not smoking.  Limiting alcoholic beverages.  Learning ways to reduce stress. If lifestyle changes are not enough to get your blood pressure under control, your health care provider may prescribe medicine. You may need to take more than one. Work closely with your health care provider to understand the risks and benefits. HOME CARE INSTRUCTIONS  Have your blood pressure rechecked as directed by your health care provider.   Take medicines only as directed by your health care provider. Follow the directions carefully. Blood pressure medicines must be taken as prescribed. The medicine does not work as well when you skip doses. Skipping doses also puts you at risk for problems.   Do not smoke.   Monitor your blood pressure at home as directed by your health care provider. SEEK MEDICAL CARE IF:   You think you are having a reaction to medicines taken.  You have recurrent headaches or feel dizzy.  You have swelling in your ankles.  You have trouble with your vision. SEEK IMMEDIATE MEDICAL CARE IF:  You develop a severe headache or confusion.    You have unusual weakness, numbness, or feel faint.  You have severe chest or abdominal pain.  You vomit repeatedly.  You have trouble breathing. MAKE SURE YOU:   Understand these instructions.  Will watch your condition.  Will get help right away if you are not doing well or get worse. Document  Released: 12/01/2005 Document Revised: 04/17/2014 Document Reviewed: 09/23/2013 [facility] Patient Information 2015 [place], [place]. This information is not intended to replace advice given to you by your health care provider. Make sure you discuss any questions you have with your health care provider. Menorrhagia Menorrhagia is the medical term for when your menstrual periods are heavy or last longer than usual. With menorrhagia, every period you have may cause enough blood loss and cramping that you are unable to maintain your usual activities. CAUSES  In some cases, the cause of heavy periods is unknown, but a number of conditions may cause menorrhagia. Common causes include:  A problem with the hormone-producing thyroid gland (hypothyroid).  Noncancerous growths in the uterus (polyps or fibroids).  An imbalance of the estrogen and progesterone hormones.  One of your ovaries not releasing an egg during one or more months.  Side effects of having an intrauterine device (IUD).  Side effects of some medicines, such as anti-inflammatory medicines or blood thinners.  A bleeding disorder that stops your blood from clotting normally. SIGNS AND SYMPTOMS  During a normal period, bleeding lasts between 4 and 8 days. Signs that your periods are too heavy include:  You routinely have to change your pad or tampon every 1 or 2 hours because it is completely soaked.  You pass blood clots larger than 1 inch (2.5 cm) in size.  You have bleeding for more than 7 days.  You need to use pads and tampons at the same time because of heavy bleeding.  You need to wake up to change your pads or tampons during the night.  You have symptoms of anemia, such as tiredness, fatigue, or shortness of breath. DIAGNOSIS  Your health care provider will perform a physical exam and ask you questions about your symptoms and menstrual history. Other tests may be ordered based on what the health care provider finds  during the exam. These tests can include:  Blood tests. Blood tests are used to check if you are pregnant or have hormonal changes, a bleeding or thyroid disorder, low iron levels (anemia), or other problems.  Endometrial biopsy. Your health care provider takes a sample of tissue from the inside of your uterus to be examined under a microscope.  Pelvic ultrasound. This test uses sound waves to make a picture of your uterus, ovaries, and vagina. The pictures can show if you have fibroids or other growths.  Hysteroscopy. For this test, your health care provider will use a small telescope to look inside your uterus. Based on the results of your initial tests, your health care provider may recommend further testing. TREATMENT  Treatment may not be needed. If it is needed, your health care provider may recommend treatment with one or more medicines first. If these do not reduce bleeding enough, a surgical treatment might be an option. The best treatment for you will depend on:   Whether you need to prevent pregnancy.  Your desire to have children in the future.  The cause and severity of your bleeding.  Your opinion and personal preference.  Medicines for menorrhagia may include:  Birth control methods that use hormones. These include the pill, skin patch,  vaginal ring, shots that you get every 3 months, hormonal IUD, and implant. These treatments reduce bleeding during your menstrual period.  Medicines that thicken blood and slow bleeding.  Medicines that reduce swelling, such as ibuprofen.  Medicines that contain a synthetic hormone called progestin.   Medicines that make the ovaries stop working for a short time.  You may need surgical treatment for menorrhagia if the medicines are unsuccessful. Treatment options include:  Dilation and curettage (D&C). In this procedure, your health care provider opens (dilates) your cervix and then scrapes or suctions tissue from the lining of  your uterus to reduce menstrual bleeding.  Operative hysteroscopy. This procedure uses a tiny tube with a light (hysteroscope) to view your uterine cavity and can help in the surgical removal of a polyp that may be causing heavy periods.  Endometrial ablation. Through various techniques, your health care provider permanently destroys the entire lining of your uterus (endometrium). After endometrial ablation, most women have little or no menstrual flow. Endometrial ablation reduces your ability to become pregnant.  Endometrial resection. This surgical procedure uses an electrosurgical wire loop to remove the lining of the uterus. This procedure also reduces your ability to become pregnant.  Hysterectomy. Surgical removal of the uterus and cervix is a permanent procedure that stops menstrual periods. Pregnancy is not possible after a hysterectomy. This procedure requires anesthesia and hospitalization. HOME CARE INSTRUCTIONS   Only take over-the-counter or prescription medicines as directed by your health care provider. Take prescribed medicines exactly as directed. Do not change or switch medicines without consulting your health care provider.  Take any prescribed iron pills exactly as directed by your health care provider. Long-term heavy bleeding may result in low iron levels. Iron pills help replace the iron your body lost from heavy bleeding. Iron may cause constipation. If this becomes a problem, increase the bran, fruits, and roughage in your diet.  Do not take aspirin or medicines that contain aspirin 1 week before or during your menstrual period. Aspirin may make the bleeding worse.  If you need to change your sanitary pad or tampon more than once every 2 hours, stay in bed and rest as much as possible until the bleeding stops.  Eat well-balanced meals. Eat foods high in iron. Examples are leafy green vegetables, meat, liver, eggs, and whole grain breads and cereals. Do not try to lose  weight until the abnormal bleeding has stopped and your blood iron level is back to normal. SEEK MEDICAL CARE IF:   You soak through a pad or tampon every 1 or 2 hours, and this happens every time you have a period.  You need to use pads and tampons at the same time because you are bleeding so much.  You need to change your pad or tampon during the night.  You have a period that lasts for more than 8 days.  You pass clots bigger than 1 inch wide.  You have irregular periods that happen more or less often than once a month.  You feel dizzy or faint.  You feel very weak or tired.  You feel short of breath or feel your heart is beating too fast when you exercise.  You have nausea and vomiting or diarrhea while you are taking your medicine.  You have any problems that may be related to the medicine you are taking. SEEK IMMEDIATE MEDICAL CARE IF:   You soak through 4 or more pads or tampons in 2 hours.  You have any  bleeding while you are pregnant. MAKE SURE YOU:   Understand these instructions.  Will watch your condition.  Will get help right away if you are not doing well or get worse. Document Released: 12/01/2005 Document Revised: 12/06/2013 Document Reviewed: 05/22/2013 [facility] Patient Information 2015 [place], [place]. This information is not intended to replace advice given to you by your health care provider. Make sure you discuss any questions you have with your health care provider.

## 2014-12-20 NOTE — Assessment & Plan Note (Signed)
Must check TSH, pelvic sono--EMB completed today and pap smear. Begin Megace to curb bleeding.  Pt. Offered hospitalization and blood transfusion butshe adamantly declined this.  Will attempt outpt. Management with Megace and Iron supplementation.

## 2014-12-20 NOTE — Telephone Encounter (Signed)
Received phone call from lab for critical hemoglobin 5.8, consistent with POC test during today's office visit. Reviewed Dr. Virginia Crews note that she offered hospitalization and transfusions and patient declined, electing to try outpatient treatment. I called patient to let her know of the confirmed low hemoglobin. She says she feels a little worse, started taking the megace and the bleeding has slowed some but not stopped. I strongly encouraged her to come to the emergency room to get a blood transfusion. Patient stated she was worried about "all of the infections" she could get from the transfusion, I informed her this is a very rare occurrence and the risk of the low hemoglobin is far greater than any chance for infection. She asks that she come to the hospital tomorrow because she is already in bed right now, I informed her she could go to the ED or call/come to clinic to potentially be direct admitted but would need to speak with a doctor regarding the direct admit. Pt verbalized understanding to immediately come to the emergency room if she develops any further symptoms, bleeding increases, dizzy or lightheaded, chest pains or feelings of heart beating fast. Note routed to Dr. Kennon Rounds for [facility].  Tawanna Sat, MD 12/20/2014, 9:57 PM PGY-2, [place]

## 2014-12-20 NOTE — Progress Notes (Signed)
Subjective:    Patient ID: Krista Foster is a 44 y.o. female presenting with Menstrual Problem  on 12/20/2014  HPI:  G3P3 with SVD x 3. Here for abnormal bleeding. Last year cycles became increasingly heavy and more clots are coming out and lasting longer and longer. Most recently has bled through 16 days.  Has a lot of cramping. Report eating ice.  She is taking iron.  Report healthy diet. She notes some DOE. Has lost 25 lbs in last 2 months without trying. Feels hot and sweaty. Notes palpitations. Not taking her BP meds, because they make her feel bad.  Review of Systems  Constitutional: Positive for unexpected weight change. Negative for fever and chills.  Respiratory: Positive for chest tightness and shortness of breath.   Cardiovascular: Positive for palpitations. Negative for chest pain.  Gastrointestinal: Negative for nausea, vomiting and abdominal pain.  Endocrine:       Eating ice  Genitourinary: Positive for vaginal bleeding. Negative for dysuria.  Skin: Negative for rash.      Objective:    BP 167/97 mmHg  Pulse 91  Temp(Src) 98.1 F (36.7 C) (Oral)  Wt 169 lb (76.658 kg)  LMP 12/05/2014 (Exact Date) Physical Exam  Constitutional: She is oriented to person, place, and time. She appears well-developed and well-nourished. No distress.  HENT:  Head: Normocephalic and atraumatic.  Eyes: No scleral icterus.  Neck: Neck supple.  Cardiovascular: Normal rate.   Pulmonary/Chest: Effort normal.  Abdominal: Soft. There is no tenderness.  Genitourinary: Uterus is enlarged (10 wk size) and tender. Cervix exhibits no motion tenderness and no friability. Right adnexum displays no mass and no tenderness. Left adnexum displays no mass and no tenderness. There is bleeding (dark blood with clots noted) in the vagina.  Neurological: She is alert and oriented to person, place, and time.  Skin: Skin is warm and dry.  Psychiatric: She has a normal mood and affect.   Patient given  informed consent, signed copy in the chart, time out was performed. Appropriate time out taken. . The patient was placed in the lithotomy position and the cervix brought into view with sterile speculum.  Portio of cervix cleansed x 2 with betadine swabs.  A tenaculum was placed in the anterior lip of the cervix.  The uterus was sounded for depth of 10 cm. A pipelle was introduced to into the uterus, suction created,  and an endometrial sample was obtained. All equipment was removed and accounted for.  The patient tolerated the procedure well.        Assessment & Plan:   Problem List Items Addressed This Visit      Unprioritized   HYPERTENSION, BENIGN SYSTEMIC    BP is in a stroke range--trial of HCTZ alone and repeat BP in 4 wks.    Relevant Medications      hydrochlorothiazide tablet   Other Relevant Orders      Comprehensive metabolic panel   MENORRHAGIA - Primary    Must check TSH, pelvic sono--EMB completed today and pap smear. Begin Megace to curb bleeding.  Pt. Offered hospitalization and blood transfusion butshe adamantly declined this.  Will attempt outpt. Management with Megace and Iron supplementation.    Relevant Medications      megestrol (MEGACE) tablet      ferrous sulfate (FER-IN-SOL) 75 (15 FE) MG/ML SOLN   Other Relevant Orders      TSH      CBC      US  Pelvis Complete      US Transvaginal Non-OB      Surgical pathology   Anemia due to chronic blood loss   Relevant Medications      ferrous sulfate (FER-IN-SOL) 75 (15 FE) MG/ML SOLN   Other Relevant Orders      CBC    Other Visit Diagnoses    Irregular menstrual cycle        Relevant Orders       POCT urine pregnancy (Completed)       Hemoglobin (Completed)    Loss of weight        Unclear etiology--check TSH, chemistries    Relevant Orders       HIV antibody    Screening for cervical cancer        None x 20 years--will check    Relevant Orders       Cytology - PAP      Return in about 4 weeks (around  01/17/2015).

## 2014-12-21 ENCOUNTER — Encounter (HOSPITAL_COMMUNITY): Payer: Self-pay | Admitting: Emergency Medicine

## 2014-12-21 ENCOUNTER — Other Ambulatory Visit: Payer: Self-pay

## 2014-12-21 ENCOUNTER — Observation Stay (HOSPITAL_COMMUNITY): Payer: BLUE CROSS/BLUE SHIELD

## 2014-12-21 ENCOUNTER — Encounter: Payer: Self-pay | Admitting: Family Medicine

## 2014-12-21 ENCOUNTER — Observation Stay (HOSPITAL_COMMUNITY)
Admission: EM | Admit: 2014-12-21 | Discharge: 2014-12-22 | Disposition: A | Discharge status: Home / Self Care | Payer: BLUE CROSS/BLUE SHIELD | Attending: Family Medicine | Admitting: Family Medicine

## 2014-12-21 DIAGNOSIS — Z9851 Tubal ligation status: Secondary | ICD-10-CM | POA: Diagnosis not present

## 2014-12-21 DIAGNOSIS — R938 Abnormal findings on diagnostic imaging of other specified body structures: Secondary | ICD-10-CM | POA: Diagnosis not present

## 2014-12-21 DIAGNOSIS — D62 Acute posthemorrhagic anemia: Principal | ICD-10-CM | POA: Diagnosis present

## 2014-12-21 DIAGNOSIS — H9202 Otalgia, left ear: Secondary | ICD-10-CM | POA: Insufficient documentation

## 2014-12-21 DIAGNOSIS — F17210 Nicotine dependence, cigarettes, uncomplicated: Secondary | ICD-10-CM | POA: Diagnosis not present

## 2014-12-21 DIAGNOSIS — D649 Anemia, unspecified: Secondary | ICD-10-CM | POA: Insufficient documentation

## 2014-12-21 DIAGNOSIS — G43909 Migraine, unspecified, not intractable, without status migrainosus: Secondary | ICD-10-CM | POA: Diagnosis not present

## 2014-12-21 DIAGNOSIS — D500 Iron deficiency anemia secondary to blood loss (chronic): Secondary | ICD-10-CM | POA: Insufficient documentation

## 2014-12-21 DIAGNOSIS — R0789 Other chest pain: Secondary | ICD-10-CM | POA: Insufficient documentation

## 2014-12-21 DIAGNOSIS — N920 Excessive and frequent menstruation with regular cycle: Secondary | ICD-10-CM | POA: Insufficient documentation

## 2014-12-21 DIAGNOSIS — R5383 Other fatigue: Secondary | ICD-10-CM | POA: Diagnosis present

## 2014-12-21 DIAGNOSIS — I10 Essential (primary) hypertension: Secondary | ICD-10-CM | POA: Insufficient documentation

## 2014-12-21 DIAGNOSIS — R0602 Shortness of breath: Secondary | ICD-10-CM

## 2014-12-21 HISTORY — DX: Anemia, unspecified: D64.9

## 2014-12-21 LAB — HEMOGLOBIN AND HEMATOCRIT, BLOOD
HCT: 28.3 % — ABNORMAL LOW (ref 36.0–46.0)
Hemoglobin: 8.3 g/dL — ABNORMAL LOW (ref 12.0–15.0)

## 2014-12-21 LAB — PROTIME-INR
INR: 1 (ref 0.00–1.49)
PROTHROMBIN TIME: 13.3 s (ref 11.6–15.2)

## 2014-12-21 LAB — TROPONIN I: Troponin I: <0.03 ng/mL (ref <0.031)

## 2014-12-21 LAB — ABO/RH: ABO/RH(D): O POS

## 2014-12-21 LAB — PREPARE RBC (CROSSMATCH)

## 2014-12-21 LAB — HIV ANTIBODY (ROUTINE TESTING W REFLEX): HIV: NONREACTIVE

## 2014-12-21 MED ORDER — SODIUM CHLORIDE 0.9 % IV SOLN: 250.0000 mL | INTRAVENOUS | Status: DC | PRN

## 2014-12-21 MED ORDER — AMLODIPINE BESYLATE 10 MG PO TABS
10.0000 mg | ORAL_TABLET | Freq: Every day | ORAL | Status: DC
  Administered 2014-12-21 – 2014-12-22 (×2): 10 mg via ORAL
  Filled 2014-12-21: qty 1
  Filled 2014-12-21: qty 2

## 2014-12-21 MED ORDER — SODIUM CHLORIDE 0.9 % IJ SOLN: 3.0000 mL | INTRAMUSCULAR | Status: DC | PRN

## 2014-12-21 MED ORDER — ENSURE COMPLETE PO LIQD
237.0000 mL | Freq: Two times a day (BID) | ORAL | Status: DC
  Administered 2014-12-22: 237 mL via ORAL

## 2014-12-21 MED ORDER — SODIUM CHLORIDE 0.9 % IJ SOLN
3.0000 mL | Freq: Two times a day (BID) | INTRAMUSCULAR | Status: DC
  Administered 2014-12-21 (×2): 3 mL via INTRAVENOUS

## 2014-12-21 MED ORDER — FLUTICASONE PROPIONATE 50 MCG/ACT NA SUSP
2.0000 | Freq: Every day | NASAL | Status: DC
  Administered 2014-12-21 – 2014-12-22 (×2): 2 via NASAL
  Filled 2014-12-21: qty 16

## 2014-12-21 MED ORDER — PANTOPRAZOLE SODIUM 40 MG PO TBEC
40.0000 mg | DELAYED_RELEASE_TABLET | Freq: Every day | ORAL | Status: DC
  Filled 2014-12-21: qty 1

## 2014-12-21 MED ORDER — SODIUM CHLORIDE 0.9 % IV SOLN
Freq: Once | INTRAVENOUS | Status: AC
  Administered 2014-12-21: 10:00:00 via INTRAVENOUS

## 2014-12-21 MED ORDER — MEGESTROL ACETATE 40 MG PO TABS
80.0000 mg | ORAL_TABLET | Freq: Four times a day (QID) | ORAL | Status: DC
  Administered 2014-12-21 – 2014-12-22 (×3): 80 mg via ORAL
  Filled 2014-12-21 (×7): qty 2

## 2014-12-21 MED ORDER — LISINOPRIL 20 MG PO TABS: 20.0000 mg | ORAL_TABLET | Freq: Every day | ORAL | Status: DC

## 2014-12-21 NOTE — ED Notes (Signed)
Pt states she feels flushed, rate on blood slowed down to 50 mL/hr.

## 2014-12-21 NOTE — ED Notes (Signed)
Attempted report 

## 2014-12-21 NOTE — H&P (Signed)
[facility] Admission History and Physical Service Pager: [phone]  Patient name: Krista Foster Medical record number: [id number] Date of birth: August 01, 1971 Age: 44 y.o. Gender: female  Primary Care Provider: Annabell Sabal, MD Consultants: None Code Status: Full  Chief Complaint: Weakness and fatigue, vaginal bleeding  Assessment and Plan: CATHARINE Foster is a 44 y.o. female presenting with weakness and fatigue after 3 weeks of heavy vaginal bleeding . PMH is significant for menorrhagia, GERD, tobacco abuse, hypertension, and migraine headaches.  Vaginal bleeding, acute on chronic blood loss anemia - Admit to MedSurg on the family medicine teaching service for transfusion - Patient hemodynamically stable, hemoglobin 5.8 - Vaginal bleeding almost resolved today on Megace, etiology of bleeding likely fibroid uterus versus neoplasia - On exam yesterday by GYN found to have 10 week size uterus and reports 25 pound unwanted weight loss - Transfuse 2 units, follow-up CBC posttransfusion - Dyspnea on exertion, palpitations, and chest pain all possibly due to acute blood loss anemia - Monitor clinically for improvement of signs of anemia - With complaint of increased time of bleeding will check PT INR, consider further workup for bleeding disorder  Hypertension  - Blood pressure elevated today similar to her presentation one day ago - Considering symptomatically anemia and volume contraction will avoid HCTZ and ACE inhibitor for now, these are her home meds - Amlodipine 10 mg for now - Monitor for need of additional medications, consider beta blocker with chest pain and smoking status  Chest pain - Atypical chest pain which was experienced momentarily one day ago. - EKG now, single troponin, given timeline this should rule out MI - Has risk factors including hypertension, current every day smoker - Previous LDL 106  In February 2013  Ear pain and difficulty  hearing - C/o mild symptoms over the last 1-2 years, with congestion - L TM with effusion and thickened appearance likely chronic effusion - start flonase, discussed with pt  Tobacco abuse-contemplative, encouraged Migraine headaches-no current headache, no prophylaxis necessary, monitor GERD-continue home PPI  FEN/GI: Regular diet, saline lock IV, transfuse 2 units Prophylaxis: SCDs, avoid anticoagulants for now due to vaginal bleeding  Disposition: MedSurg for observation during transfusions and continued workup.  History of Present Illness: Krista Foster is a 44 y.o. female presenting with 3 weeks of progressive weakness, fatigue, dyspnea on exertion, and perceived tachycardia with exercise. Patient explains that she had heavy. Over the last 3 weeks. For 2 weeks it was very heavy where she used 30 pads per day as well as tampons, however the last week it lightened up significantly. She was seen one day ago in the office and offered admission after point-of-care hemoglobin was found to be 5.8. A pelvic exam, Pap smear, endometrial biopsy, and lab workup were all started there. Pap smear and EMB are pending, TSH and other labs unremarkable. She initially refused admission, she again refused admission over the phone last night, but presents to the ER today with symptoms of anemia.  She explains that over the last one 2 years she has had very heavy periods requiring up to 40 pads per day. She usually bleeds for 5 days only and they happen at regular 28 day intervals. This last period was unusual in that she bled for 3 weeks straight.  She also complains that over the last year or so she's had an abnormally long time of bleeding with small nicks or cuts on her hands.  Review Of Systems: Per HPI, Otherwise  12 point review of systems was performed and was unremarkable.  Patient Active Problem List   Diagnosis Date Noted  . Anemia 12/21/2014  . Anemia due to chronic blood loss 12/20/2014  .  Fatigue 09/05/2011  . DYSPNEA 10/25/2010  . TOBACCO USER 09/22/2009  . OVERWEIGHT 08/01/2008  . MENORRHAGIA 08/01/2008  . DEPRESSIVE DISORDER, NOS 02/11/2007  . MIGRAINE, UNSPEC., W/O INTRACTABLE MIGRAINE 02/11/2007  . HYPERTENSION, BENIGN SYSTEMIC 02/11/2007   Past Medical History: Past Medical History  Diagnosis Date  . Hypertension    Past Surgical History: Past Surgical History  Procedure Laterality Date  . Tubal ligation     Social History: History  Substance Use Topics  . Smoking status: Current Every Day Smoker -- 0.30 packs/day    Types: Cigarettes  . Smokeless tobacco: Never Used  . Alcohol Use: Not on file   Additional social history: Please also refer to relevant sections of EMR.  Family History: History reviewed. No pertinent family history. Allergies and Medications: No Known Allergies No current facility-administered medications on file prior to encounter.   Current Outpatient Prescriptions on File Prior to Encounter  Medication Sig Dispense Refill  . ferrous sulfate (FER-IN-SOL) 75 (15 FE) MG/ML SOLN Take 1 mL (15 mg of iron total) by mouth 2 (two) times daily. 50 mL 3  . hydrochlorothiazide (HYDRODIURIL) 25 MG tablet Take 1 tablet (25 mg total) by mouth daily. 30 tablet 3  . lisinopril-hydrochlorothiazide (PRINZIDE,ZESTORETIC) 20-25 MG per tablet Take 0.5 tablets by mouth daily. 45 tablet 1  . megestrol (MEGACE) 40 MG tablet Take 2 tablets (80 mg total) by mouth 4 (four) times daily. 30 tablet 3  . omeprazole (PRILOSEC) 20 MG capsule Take 1 capsule (20 mg total) by mouth daily. 90 capsule 1    Objective: BP 150/97 mmHg  Pulse 99  Temp(Src) 98.6 F (37 C) (Oral)  Resp 11  SpO2 100%  LMP 12/05/2014 (Exact Date) Exam: Gen: NAD, alert, cooperative with exam HEENT: NCAT, pale conjunctiva, sclera white, left TM with effusion and slightly thickened appearance CV: RRR, good S1/S2, no murmur Resp: CTABL, no wheezes, non-labored Abd: SNTND, BS present,  no guarding or organomegaly Ext: No edema, warm Neuro: Alert and oriented, strength 5/5 and sensation intact in bilateral upper and lower extremities, normal speech  Of note pelvic exam on 12/20/2014 by Dr. Kennon Rounds, [facility] Aly med physician: Genitourinary: Uterus is enlarged (10 wk size) and tender. Cervix exhibits no motion tenderness and no friability. Right adnexum displays no mass and no tenderness. Left adnexum displays no mass and no tenderness. There is bleeding (dark blood with clots noted) in the vagina.    Labs and Imaging: CBC BMET   Recent Labs Lab 12/20/14 1136  WBC 7.4  HGB 5.8*  HCT 21.0*  PLT 342    Recent Labs Lab 12/20/14 1136  NA 138  K 4.5  CL 106  CO2 24  BUN 8  CREATININE 0.50  GLUCOSE 88  CALCIUM 9.0     EKG, troponin pending  Pelvic ultrasound 12/20/2014 IMPRESSION: Heterogeneous appearance of the uterus with abnormal contents of the endometrium. This may represent a 2.5 cm polyp or fibroid projecting into the endometrium distorting the endometrial lining. If further imaging is clinically desired then MR may be considered given the patient's refusal of transvaginal ultrasound exam.  Timmothy Euler, MD 12/21/2014, 10:25 AM PGY-3, [place] Intern pager: [phone], text pages welcome

## 2014-12-21 NOTE — ED Notes (Signed)
Called Flow re: bed request and placement status.

## 2014-12-21 NOTE — ED Notes (Signed)
Pt is tolerating RBC infusion well with no noted reaction.

## 2014-12-21 NOTE — ED Provider Notes (Addendum)
CSN: [id number]     Arrival date & time 12/21/14  9449 History   First MD Initiated Contact with Patient 12/21/14 [phone]     Chief Complaint  Patient presents with  . Anemia     (Consider location/radiation/quality/duration/timing/severity/associated sxs/prior Treatment) HPI Ines of generalized weakness for approximately 2 weeks area. No other associated symptoms. Nothing makes symptoms better or worse she saw her OB/GYN yesterday for menorrhagia, had CBC obtained resulting in hemoglobin 5.8 and was told that she needs to have a blood transfusion. She reports she had menstrual period 3.5 weeks ago and has had continued spotting since. Denies pain anywhere. Past Medical History  Diagnosis Date  . Hypertension    Past Surgical History  Procedure Laterality Date  . Tubal ligation     History reviewed. No pertinent family history. History  Substance Use Topics  . Smoking status: Current Every Day Smoker -- 0.30 packs/day    Types: Cigarettes  . Smokeless tobacco: Never Used  . Alcohol Use: Not on file   OB History    No data available     Review of Systems  HENT: Negative.   Respiratory: Negative.   Cardiovascular: Negative.   Gastrointestinal: Negative.   Genitourinary: Positive for vaginal bleeding and menstrual problem.  Musculoskeletal: Negative.   Skin: Negative.   Neurological: Positive for weakness.  Psychiatric/Behavioral: Negative.   All other systems reviewed and are negative.     Allergies  Review of patient's allergies indicates no known allergies.  Home Medications   Prior to Admission medications   Medication Sig Start Date End Date Taking? Authorizing Provider  ferrous sulfate (FER-IN-SOL) 75 (15 FE) MG/ML SOLN Take 1 mL (15 mg of iron total) by mouth 2 (two) times daily. 12/20/14  Yes Donnamae Jude, MD  hydrochlorothiazide (HYDRODIURIL) 25 MG tablet Take 1 tablet (25 mg total) by mouth daily. 12/20/14  Yes Donnamae Jude, MD  lisinopril-hydrochlorothiazide  (PRINZIDE,ZESTORETIC) 20-25 MG per tablet Take 0.5 tablets by mouth daily. 01/22/12 12/21/14 Yes Lyndal Pulley, DO  megestrol (MEGACE) 40 MG tablet Take 2 tablets (80 mg total) by mouth 4 (four) times daily. 12/20/14  Yes Donnamae Jude, MD  omeprazole (PRILOSEC) 20 MG capsule Take 1 capsule (20 mg total) by mouth daily. 01/22/12 12/21/14 Yes Zachary M Smith, DO   BP 150/97 mmHg  Pulse 99  Temp(Src) 98.6 F (37 C) (Oral)  Resp 11  SpO2 100%  LMP 12/05/2014 (Exact Date) Physical Exam  Constitutional: She appears well-developed and well-nourished.  HENT:  Head: Normocephalic and atraumatic.  Eyes: Conjunctivae are normal. Pupils are equal, round, and reactive to light.  Neck: Neck supple. No tracheal deviation present. No thyromegaly present.  Cardiovascular: Normal rate and regular rhythm.   No murmur heard. Pulmonary/Chest: Effort normal and breath sounds normal.  Abdominal: Soft. Bowel sounds are normal. She exhibits no distension. There is no tenderness.  Musculoskeletal: Normal range of motion. She exhibits no edema or tenderness.  Neurological: She is alert. Coordination normal.  Skin: Skin is warm and dry. No rash noted.  Psychiatric: She has a normal mood and affect.  Nursing note and vitals reviewed.   ED Course  Procedures (including critical care time) Labs Review Labs Reviewed  TYPE AND SCREEN    Imaging Review No results found.   EKG Interpretation None      Date: 12/21/2014  Rate: 85  Rhythm: normal sinus rhythm  QRS Axis: normal  Intervals: normal  ST/T Wave abnormalities: normal  Conduction  Disutrbances: none  Narrative Interpretation: unremarkable     Type and cross and blood transfusion ordered in the ED. I spoke with Dr.Constance. Patient should be instructed to contact Dr.Pratt shortly after leaving the hospital after being transfused Results for orders placed or performed during the hospital encounter of 12/21/14  Type and screen  Result Value Ref  Range   ABO/RH(D) O POS    Antibody Screen NEG    Sample Expiration 12/24/2014    Unit Number [id number]    Blood Component Type RED CELLS,LR    Unit division 00    Status of Unit ISSUED    Transfusion Status OK TO TRANSFUSE    Crossmatch Result Compatible    Unit Number [id number]    Blood Component Type RED CELLS,LR    Unit division 00    Status of Unit ALLOCATED    Transfusion Status OK TO TRANSFUSE    Crossmatch Result Compatible   Prepare RBC  Result Value Ref Range   Order Confirmation ORDER PROCESSED BY BLOOD BANK   ABO/Rh  Result Value Ref Range   ABO/RH(D) O POS    US Pelvis Complete  12/21/2014   CLINICAL DATA:  44 year old female with menorrhagia and anemia. Initial encounter.  EXAM: TRANSABDOMINAL ULTRASOUND OF PELVIS  TECHNIQUE: Transabdominal ultrasound examination of the pelvis was performed including evaluation of the uterus, ovaries, adnexal regions, and pelvic cul-de-sac.  COMPARISON:  08/02/2008.  FINDINGS: Uterus  Measurements: 10.6 x 5.2 x 6.6 cm. Heterogeneous appearance with abnormal contents of the endometrium. This may represent a 2.5 cm polyp or fibroid projecting into this region. The patient refused transvaginal imaging.  Endometrium  Thickness: Distorted by above described findings.  Right ovary  Measurements: 3.4 x 2.6 x 2.7 cm. Normal appearance/no adnexal mass.  Left ovary  Measurements: 3.6 x 2.4 x 1.9 cm. Normal appearance/no adnexal mass.  Other findings:  No free fluid  IMPRESSION: Heterogeneous appearance of the uterus with abnormal contents of the endometrium. This may represent a 2.5 cm polyp or fibroid projecting into the endometrium distorting the endometrial lining. If further imaging is clinically desired then MR may be considered given the patient's refusal of transvaginal ultrasound exam.   Electronically Signed   By: Chauncey Cruel M.D.   On: 12/21/2014 10:04     MDM  Spoke with Dr.Bradshaw. Plan 23 hour observation to continue  transfusion Diagnosis symptomatic anemia secondary to chronic blood loss Final diagnoses:  None        Orlie Dakin, MD 12/21/14 Real, MD 12/21/14 2542  Orlie Dakin, MD 12/21/14 1119

## 2014-12-21 NOTE — ED Notes (Signed)
Explained to the patient the labs will need to be recollected to verify hgb. Patient consents to blood transfusion and reports she has never received a blood transfusion before.

## 2014-12-21 NOTE — ED Notes (Signed)
Pt  VS WNL, and sx have ceased. Pt states she believes she was just becoming anxious about the new bag. Rate increased back to 120 mL/hr.

## 2014-12-21 NOTE — Progress Notes (Signed)
   Spoke with Junie Panning in ED to get report. They will call me back.

## 2014-12-21 NOTE — Progress Notes (Signed)
Arrived to 5w06 from ED, oriented to room and surroundings, denies nausea at this time, c/o cramping in lower abd.

## 2014-12-21 NOTE — ED Notes (Signed)
Patient reports she went to her gyno doctor yesterday and lab work was drawn.  She was called last night with Hgb of 5.0. She comes here alert and oriented, reports weakness.  No apparent distress at this time.

## 2014-12-21 NOTE — ED Notes (Signed)
Phlebotomy at bedside.

## 2014-12-21 NOTE — Progress Notes (Signed)
Teaching service notified of arrival to 5w05 [phone])

## 2014-12-21 NOTE — Progress Notes (Signed)
Pt arrived to unit with second unit of PRBC infusing from ED

## 2014-12-22 DIAGNOSIS — D500 Iron deficiency anemia secondary to blood loss (chronic): Secondary | ICD-10-CM | POA: Insufficient documentation

## 2014-12-22 LAB — TYPE AND SCREEN
ABO/RH(D): O POS
Antibody Screen: NEGATIVE
UNIT DIVISION: 0
Unit division: 0

## 2014-12-22 LAB — CBC
HEMATOCRIT: 26 % — AB (ref 36.0–46.0)
Hemoglobin: 7.2 g/dL — ABNORMAL LOW (ref 12.0–15.0)
MCH: 18.6 pg — ABNORMAL LOW (ref 26.0–34.0)
MCHC: 27.7 g/dL — ABNORMAL LOW (ref 30.0–36.0)
MCV: 67.2 fL — ABNORMAL LOW (ref 78.0–100.0)
PLATELETS: 242 10*3/uL (ref 150–400)
RBC: 3.87 MIL/uL (ref 3.87–5.11)
RDW: 21.4 % — AB (ref 11.5–15.5)
WBC: 6.3 10*3/uL (ref 4.0–10.5)

## 2014-12-22 MED ORDER — FERROUS SULFATE 75 (15 FE) MG/ML PO SOLN: 15.0000 mg | Freq: Three times a day (TID) | ORAL | Status: DC

## 2014-12-22 MED ORDER — FLUTICASONE PROPIONATE 50 MCG/ACT NA SUSP: 2.0000 | Freq: Every day | NASAL | Status: DC

## 2014-12-22 MED ORDER — SENNA 8.6 MG PO TABS: 1.0000 | ORAL_TABLET | Freq: Every day | ORAL | Status: DC | PRN

## 2014-12-22 NOTE — Discharge Instructions (Signed)
Please increase the amount of iron you are taking to three times a day. It is recommended to take your Iron with orange juice and avoid consuming milk with Iron. Iron can cause constipation and black stools. I will send in a prescription of Senokot for constipation. Please call the office if you experience any of symptoms you originally presented with or the symptoms described below.  Anemia, Nonspecific Anemia is a condition in which the concentration of red blood cells or hemoglobin in the blood is below normal. Hemoglobin is a substance in red blood cells that carries oxygen to the tissues of the body. Anemia results in not enough oxygen reaching these tissues.  CAUSES  Common causes of anemia include:   Excessive bleeding. Bleeding may be internal or external. This includes excessive bleeding from periods (in women) or from the intestine.   Poor nutrition.   Chronic kidney, thyroid, and liver disease.  Bone marrow disorders that decrease red blood cell production.  Cancer and treatments for cancer.  HIV, AIDS, and their treatments.  Spleen problems that increase red blood cell destruction.  Blood disorders.  Excess destruction of red blood cells due to infection, medicines, and autoimmune disorders. SIGNS AND SYMPTOMS   Minor weakness.   Dizziness.   Headache.  Palpitations.   Shortness of breath, especially with exercise.   Paleness.  Cold sensitivity.  Indigestion.  Nausea.  Difficulty sleeping.  Difficulty concentrating. Symptoms may occur suddenly or they may develop slowly.  DIAGNOSIS  Additional blood tests are often needed. These help your health care provider determine the best treatment. Your health care provider will check your stool for blood and look for other causes of blood loss.  TREATMENT  Treatment varies depending on the cause of the anemia. Treatment can include:   Supplements of iron, vitamin Z61, or folic acid.   Hormone  medicines.   A blood transfusion. This may be needed if blood loss is severe.   Hospitalization. This may be needed if there is significant continual blood loss.   Dietary changes.  Spleen removal. HOME CARE INSTRUCTIONS Keep all follow-up appointments. It often takes many weeks to correct anemia, and having your health care provider check on your condition and your response to treatment is very important. SEEK IMMEDIATE MEDICAL CARE IF:   You develop extreme weakness, shortness of breath, or chest pain.   You become dizzy or have trouble concentrating.  You develop heavy vaginal bleeding.   You develop a rash.   You have bloody or black, tarry stools.   You faint.   You vomit up blood.   You vomit repeatedly.   You have abdominal pain.  You have a fever or persistent symptoms for more than 2-3 days.   You have a fever and your symptoms suddenly get worse.   You are dehydrated.  MAKE SURE YOU:  Understand these instructions.  Will watch your condition.  Will get help right away if you are not doing well or get worse. Document Released: 01/08/2005 Document Revised: 08/03/2013 Document Reviewed: 05/27/2013 [facility] Patient Information 2015 [place], [place]. This information is not intended to replace advice given to you by your health care provider. Make sure you discuss any questions you have with your health care provider. Fibroids Fibroids are lumps (tumors) that can occur any place in a woman's body. These lumps are not cancerous. Fibroids vary in size, weight, and where they grow. HOME CARE  Do not take aspirin.  Write down the number of pads  or tampons you use during your period. Tell your doctor. This can help determine the best treatment for you. GET HELP RIGHT AWAY IF:  You have pain in your lower belly (abdomen) that is not helped with medicine.  You have cramps that are not helped with medicine.  You have more bleeding between or during  your period.  You feel lightheaded or pass out (faint).  Your lower belly pain gets worse. MAKE SURE YOU:  Understand these instructions.  Will watch your condition.  Will get help right away if you are not doing well or get worse. Document Released: 01/03/2011 Document Revised: 02/23/2012 Document Reviewed: 01/03/2011 [facility] Patient Information 2015 [place], [place]. This information is not intended to replace advice given to you by your health care provider. Make sure you discuss any questions you have with your health care provider.

## 2014-12-22 NOTE — Progress Notes (Signed)
Family Medicine Teaching Service Daily Progress Note Intern Pager: [phone]  Patient name: Krista Foster Medical record number: [id number] Date of birth: 04/04/1971 Age: 44 y.o. Gender: female  Primary Care Provider: Annabell Sabal, MD Consultants: None Code Status: Full  Assessment and Plan: 44 y.o. female presenting with weakness and fatigue after 3 weeks of heavy vaginal bleeding . PMH is significant for menorrhagia, GERD, tobacco abuse, hypertension, and migraine headaches.  # Vaginal bleeding, acute on chronic blood loss anemia- Hemoglobin 5.8 at admission. Bleeding almost resolved at admission on Megace. Etiology of bleeding likely fibroids versus neoplasia. On exam on 1/6 by GYN found to have 10 week size uterus and reports 25 pound unwanted weight loss - Denies symptoms of anemia at this time. States all of symptoms at presentation have resolved. No vaginal bleeding since yesterday. - Transfused 2 units on 1/7 - Hemoglobin 7.2, decreased from post-transfusion level of 8.3 but suspect due to lysis of transfused cells. Within expected range post transfusion. - PTT 13.3 - INR 1 - [place]- heterogenous uterus with abnormal contents of endometrium. May represent 2.5cm polyp or fibroid.  # Hypertension - BP 135/87 today  - Considering symptomatically anemia and volume contraction will avoid HCTZ and ACE inhibitor for now, these are her home meds - Amlodipine 10 mg for now - Monitor for need of additional medications, consider beta blocker with chest pain and smoking status  FEN/GI: Heart Healthy diet, saline lock IV, transfuse 2 units Prophylaxis: SCDs, avoid anticoagulants for now due to vaginal bleeding  Disposition: Placed in Observation by [facility] Medicine Teaching Service. Discharge today.  Subjective:  No acute complaints overnight.  Denies any weakness or fatigue. States vaginal bleeding has resolved and denise any bleeding since yesterday. Anxious to leave hospital. Would like  order to be able to leave room and walk. No further complaints today.  Objective: Temp:  [98.2 F (36.8 C)-100.4 F (38 C)] 98.3 F (36.8 C) (01/08 0518) Pulse Rate:  [71-103] 71 (01/08 0518) Resp:  [12-26] 12 (01/08 0518) BP: (123-157)/(63-100) 135/87 mmHg (01/08 0518) SpO2:  [99 %-100 %] 100 % (01/08 0518) Weight:  [168 lb (76.204 kg)] 168 lb (76.204 kg) (01/07 1526) Physical Exam: General: 44yo female resting comfortably in no apparent distress Cardiovascular: S1 and S2 noted. No murmurs/rubs/gallops. Respiratory: Clear to auscultation bilaterally. No wheezes/rales/rhonchi. No increased work of breathing. Abdomen: Bowel sounds noted. Soft and nondistended. No tenderness to palpation. Extremities: No edema noted.  Laboratory:  Recent Labs Lab 12/20/14 1136 12/21/14 1952 12/22/14 0532  WBC 7.4  --  6.3  HGB 5.8* 8.3* 7.2*  HCT 21.0* 28.3* 26.0*  PLT 342  --  242    Recent Labs Lab 12/20/14 1136  NA 138  K 4.5  CL 106  CO2 24  BUN 8  CREATININE 0.50  CALCIUM 9.0  PROT 6.5  BILITOT 0.3  ALKPHOS 51  ALT <8  AST 11  GLUCOSE 88  - Troponins negative - PT 13.3 - INR 1  Imaging/Diagnostic Tests: US Pelvis Complete  12/21/2014   CLINICAL DATA:  44 year old female with menorrhagia and anemia. Initial encounter.  EXAM: TRANSABDOMINAL ULTRASOUND OF PELVIS  TECHNIQUE: Transabdominal ultrasound examination of the pelvis was performed including evaluation of the uterus, ovaries, adnexal regions, and pelvic cul-de-sac.  COMPARISON:  08/02/2008.  FINDINGS: Uterus  Measurements: 10.6 x 5.2 x 6.6 cm. Heterogeneous appearance with abnormal contents of the endometrium. This may represent a 2.5 cm polyp or fibroid projecting into this region. The patient refused transvaginal  imaging.  Endometrium  Thickness: Distorted by above described findings.  Right ovary  Measurements: 3.4 x 2.6 x 2.7 cm. Normal appearance/no adnexal mass.  Left ovary  Measurements: 3.6 x 2.4 x 1.9 cm. Normal  appearance/no adnexal mass.  Other findings:  No free fluid  IMPRESSION: Heterogeneous appearance of the uterus with abnormal contents of the endometrium. This may represent a 2.5 cm polyp or fibroid projecting into the endometrium distorting the endometrial lining. If further imaging is clinically desired then MR may be considered given the patient's refusal of transvaginal ultrasound exam.   Electronically Signed   By: Chauncey Cruel M.D.   On: 12/21/2014 10:04   Lorna Few, DO 12/22/2014, 7:26 AM PGY-1, [place] Intern pager: [phone], text pages welcome

## 2014-12-22 NOTE — Care Management Note (Signed)
    Page 1 of 1   12/22/2014     2:58:13 PM CARE MANAGEMENT NOTE 12/22/2014  Patient:  PHENIX, GREIN   Account Number:  [id number]  Date Initiated:  12/22/2014  Documentation initiated by:  Tomi Bamberger  Subjective/Objective Assessment:   dx anemia  admit as observation- from home.     Action/Plan:   Anticipated DC Date:  12/22/2014   Anticipated DC Plan:  [place]  CM consult      Choice offered to / List presented to:             Status of service:  Completed, signed off Medicare Important Message given?  NO (If response is "NO", the following Medicare IM given date fields will be blank) Date Medicare IM given:   Medicare IM given by:   Date Additional Medicare IM given:   Additional Medicare IM given by:    Discharge Disposition:  HOME/SELF CARE  Per UR Regulation:  Reviewed for med. necessity/level of care/duration of stay  If discussed at [place] of Stay Meetings, dates discussed:    Comments:  12/22/14 [place], BSN [phone]  patient for dc today, no needs anticipated.

## 2014-12-22 NOTE — Progress Notes (Signed)
Nutrition Brief Note  Patient identified on the Malnutrition Screening Tool (MST) Report  Wt Readings from Last 15 Encounters:  12/21/14 168 lb (76.204 kg)  12/20/14 169 lb (76.658 kg)  01/27/12 188 lb (85.276 kg)  01/22/12 180 lb (81.647 kg)  09/05/11 180 lb (81.647 kg)  08/08/11 177 lb (80.287 kg)  07/24/11 181 lb 8 oz (82.328 kg)  10/25/10 180 lb 9.6 oz (81.92 kg)  08/14/10 178 lb (80.74 kg)  04/04/10 187 lb (84.823 kg)  04/23/09 191 lb (86.637 kg)  04/20/09 182 lb (82.555 kg)  08/01/08 191 lb (86.637 kg)  07/19/08 189 lb 11.2 oz (86.047 kg)    Body mass index is 24.11 kg/(m^2). Patient meets criteria for normal based on current BMI. Weight has been stable with usual body weight of 168 lbs.  Current diet order is heart, patient is consuming approximately 100% of meals at this time. Pt with a good appetite currently and PTA. Pt currently has Ensure BID ordered. Pt has been drinking them. RD to continue with current ordered. Labs and medications reviewed.   No nutrition interventions warranted at this time. If nutrition issues arise, please consult RD.   Kallie Locks, MS, RD, LDN Pager # [phone] After hours/ weekend pager # [phone]

## 2014-12-22 NOTE — Progress Notes (Signed)
Pt DC home with family. Had questions about d/c instructions, but refused to wait on MD to call back after being paged, states she will clarify it with her PCP. Pt refused wheelchair and walked out with family to [place].

## 2014-12-22 NOTE — Progress Notes (Signed)
UR completed 

## 2014-12-24 NOTE — Discharge Summary (Signed)
[facility] Discharge Summary  Patient name: Krista Foster Medical record number: [id number] Date of birth: Aug 09, 1971 Age: 44 y.o. Gender: female Date of Admission: 12/21/2014  Date of Discharge: 12/22/13 Admitting Physician: Lind Covert, MD  Primary Care Provider: Annabell Sabal, MD Consultants: None  Indication for Hospitalization: Anemia  Discharge Diagnoses/Problem List:  Anemia Vaginal Bleeding HTN  Disposition: Discharge Home  Discharge Condition: Stable  Brief Hospital Course:  Krista Foster was admitted on 12/21/13 following a CBC at the office showing hemoglobin of 5.8 due to significant vaginal bleeding. She has experienced three weeks of progressive weakness, fatigue, dyspnea on exertion, and perceived tachycardia on exertion. Bleeding resolved at time of presentation after initiated Megace in office.  Presenting symptoms resolved following transfusion of 2 units of blood. Post-transfusion CBC showed hemoglobin of 8.3. Hemoglobin on day of discharge trended down to 7.2, suspected to be due to expected lysis of unstable blood cells from transfusion. PT 13.3 and INR 1. US showed heterogenous uterus with abnormal contents of endometrium, which may represent a 2.5cm polyp or fibroid. Denied any symptoms of anemia (weakness, fatigue, dyspnea) prior to discharge.  Issues for Follow Up:  - CBC - Vaginal bleeding resolved at discharge with Megace. Follow up continued improvement of bleeding. - Continue to counsel on Tobacco Cessation - Nursing note states she had questions prior to discharge but refused to have nurse page doctor to answer questions. Follow up questions.  Significant Procedures: None  Significant Labs and Imaging:   Recent Labs Lab 12/20/14 1136 12/21/14 1952 12/22/14 0532  WBC 7.4  --  6.3  HGB 5.8* 8.3* 7.2*  HCT 21.0* 28.3* 26.0*  PLT 342  --  242    Recent Labs Lab 12/20/14 1136  NA 138  K 4.5  CL 106  CO2 24   GLUCOSE 88  BUN 8  CREATININE 0.50  CALCIUM 9.0  ALKPHOS 51  AST 11  ALT <8  ALBUMIN 3.9  - PTT 13.3 - INR 1  US Pelvis Complete  12/21/2014   CLINICAL DATA:  44 year old female with menorrhagia and anemia. Initial encounter.  EXAM: TRANSABDOMINAL ULTRASOUND OF PELVIS  TECHNIQUE: Transabdominal ultrasound examination of the pelvis was performed including evaluation of the uterus, ovaries, adnexal regions, and pelvic cul-de-sac.  COMPARISON:  08/02/2008.  FINDINGS: Uterus  Measurements: 10.6 x 5.2 x 6.6 cm. Heterogeneous appearance with abnormal contents of the endometrium. This may represent a 2.5 cm polyp or fibroid projecting into this region. The patient refused transvaginal imaging.  Endometrium  Thickness: Distorted by above described findings.  Right ovary  Measurements: 3.4 x 2.6 x 2.7 cm. Normal appearance/no adnexal mass.  Left ovary  Measurements: 3.6 x 2.4 x 1.9 cm. Normal appearance/no adnexal mass.  Other findings:  No free fluid  IMPRESSION: Heterogeneous appearance of the uterus with abnormal contents of the endometrium. This may represent a 2.5 cm polyp or fibroid projecting into the endometrium distorting the endometrial lining. If further imaging is clinically desired then MR may be considered given the patient's refusal of transvaginal ultrasound exam.   Electronically Signed   By: Chauncey Cruel M.D.   On: 12/21/2014 10:04   Results/Tests Pending at Time of Discharge: None  Discharge Medications:    Medication List    TAKE these medications        ferrous sulfate 75 (15 FE) MG/ML Soln  Commonly known as:  FER-IN-SOL  Take 1 mL (15 mg of iron total) by mouth 3 (three) times  daily.     fluticasone 50 MCG/ACT nasal spray  Commonly known as:  FLONASE  Place 2 sprays into both nostrils daily.     hydrochlorothiazide 25 MG tablet  Commonly known as:  HYDRODIURIL  Take 1 tablet (25 mg total) by mouth daily.     lisinopril-hydrochlorothiazide 20-25 MG per tablet   Commonly known as:  PRINZIDE,ZESTORETIC  Take 0.5 tablets by mouth daily.     megestrol 40 MG tablet  Commonly known as:  MEGACE  Take 2 tablets (80 mg total) by mouth 4 (four) times daily.     omeprazole 20 MG capsule  Commonly known as:  PRILOSEC  Take 1 capsule (20 mg total) by mouth daily.     senna 8.6 MG Tabs tablet  Commonly known as:  SENOKOT  Take 1 tablet (8.6 mg total) by mouth daily as needed for mild constipation.        Discharge Instructions: Please refer to Patient Instructions section of EMR for full details.  Patient was counseled important signs and symptoms that should prompt return to medical care, changes in medications, dietary instructions, activity restrictions, and follow up appointments.   Follow-Up Appointments:     Follow-up Information    Follow up with Donnamae Jude, MD.   Specialty:  Obstetrics and Gynecology   Contact information:   [place] [id number] [phone]       Follow up with Janora Norlander, DO On 12/29/2014.   Specialty:  Family Medicine   Why:  @1 :30pm for Hospital follow Up   Contact information:   [address] [address] [place], DO 12/24/2014, 11:14 AM PGY-1, [place]

## 2014-12-25 LAB — CYTOLOGY - PAP

## 2014-12-27 ENCOUNTER — Encounter: Payer: Self-pay | Admitting: Family Medicine

## 2014-12-27 ENCOUNTER — Ambulatory Visit (HOSPITAL_COMMUNITY)
Admission: RE | Admit: 2014-12-27 | Discharge: 2014-12-27 | Disposition: A | Discharge status: Home / Self Care | Payer: BLUE CROSS/BLUE SHIELD | Source: Ambulatory Visit | Attending: Family Medicine | Admitting: Family Medicine

## 2014-12-27 DIAGNOSIS — D250 Submucous leiomyoma of uterus: Secondary | ICD-10-CM | POA: Diagnosis not present

## 2014-12-27 DIAGNOSIS — N920 Excessive and frequent menstruation with regular cycle: Secondary | ICD-10-CM | POA: Diagnosis present

## 2014-12-27 DIAGNOSIS — D259 Leiomyoma of uterus, unspecified: Secondary | ICD-10-CM | POA: Insufficient documentation

## 2014-12-29 ENCOUNTER — Ambulatory Visit (INDEPENDENT_AMBULATORY_CARE_PROVIDER_SITE_OTHER): Payer: BLUE CROSS/BLUE SHIELD | Admitting: Family Medicine

## 2014-12-29 ENCOUNTER — Encounter: Payer: Self-pay | Admitting: Family Medicine

## 2014-12-29 VITALS — BP 156/94 | HR 73 | Ht 70.0 in | Wt 169.0 lb

## 2014-12-29 DIAGNOSIS — F17200 Nicotine dependence, unspecified, uncomplicated: Secondary | ICD-10-CM

## 2014-12-29 DIAGNOSIS — D500 Iron deficiency anemia secondary to blood loss (chronic): Secondary | ICD-10-CM | POA: Diagnosis not present

## 2014-12-29 DIAGNOSIS — Z720 Tobacco use: Secondary | ICD-10-CM | POA: Diagnosis not present

## 2014-12-29 LAB — CBC
HCT: 28.3 % — ABNORMAL LOW (ref 36.0–46.0)
Hemoglobin: 8.3 g/dL — ABNORMAL LOW (ref 12.0–15.0)
MCH: 19.3 pg — ABNORMAL LOW (ref 26.0–34.0)
MCHC: 29.3 g/dL — ABNORMAL LOW (ref 30.0–36.0)
MCV: 66 fL — ABNORMAL LOW (ref 78.0–100.0)
Platelets: 378 10*3/uL (ref 150–400)
RBC: 4.29 MIL/uL (ref 3.87–5.11)
RDW: 23.5 % — ABNORMAL HIGH (ref 11.5–15.5)
WBC: 6.6 10*3/uL (ref 4.0–10.5)

## 2014-12-29 NOTE — Progress Notes (Signed)
Patient ID: MAKAYAH PAULI, female   DOB: 1971-03-21, 44 y.o.   MRN: [id number]    Subjective: CC: hospital follow up vaginal bleeding requiring transfusion HPI: Patient is a 44 y.o. female presenting to clinic today for hospital follow up. Concerns today include:  1. Vaginal bleeding Patient reports that Saturday morning she spotted.  Also spotted this morning.  Feels pelvic pressure, like she's going to start period but has not.  Period due 1/20.  Continues to feel SOB when exerting herself.  Also endorses dizziness when she is exerting herself.  No falls or LOC. No nausea/vomiting.  Last pill of Megace Wednesday morning.  She reports that she was not informed as to what the next step would be in management of her bleeding/fibroids (eg surgery).  She states that she is not a candidate for OCPs as she is not planning on stopping smoking anytime soon.  She endorses a lot of stress at home.  Not currently on any antidepressants or anti-anxiety meds.  Social History Reviewed: current smoker. FamHx and MedHx updated.  Please see EMR.  ROS: All other systems reviewed and are negative.  Objective: Office vital signs reviewed. BP 156/94 mmHg  Pulse 73  Ht 5\' 10"  (1.778 m)  Wt 169 lb (76.658 kg)  BMI 24.25 kg/m2  LMP 12/24/2013  Physical Examination:  General: Awake, alert, well nourished, NAD HEENT: Normal, EOMI, conjunctiva pale GI: soft, NT/ND,+BS x4, no hepatomegaly, no splenomegaly, +fullness appreciated on the LLQ of abdomen but no masses palpated. MSK: Normal gait and station  Assessment: 44 y.o. female with acute blood loss causing anemia.  Plan: See Problem List and After Visit Summary   Janora Norlander, DO PGY-1, [place]

## 2014-12-29 NOTE — Patient Instructions (Signed)
It was a pleasure seeing you today!  Information regarding what we discussed is included in this packet.  Please feel free to call our office if any questions or concerns arise.  Schedule an appointment for follow up with Dr Kennon Rounds.  I will contact you with the results of your CBC.  I encourage you to stop smoking.  I think that it would be a good idea to see Dr Mingo Amber for stress/anxiety management.     Ashly M. Lajuana Ripple, DO PGY-1, [facility]   Abnormal Uterine Bleeding Abnormal uterine bleeding means bleeding from the vagina that is not your normal menstrual period. This can be:  Bleeding or spotting between periods.  Bleeding after sex (sexual intercourse).  Bleeding that is heavier or more than normal.  Periods that last longer than usual.  Bleeding after menopause. There are many problems that may cause this. Treatment will depend on the cause of the bleeding. Any kind of bleeding that is not normal should be reviewed by your doctor.  HOME CARE Watch your condition for any changes. These actions may lessen any discomfort you are having:  Do not use tampons or douches as told by your doctor.  Change your pads often. You should get regular pelvic exams and Pap tests. Keep all appointments for tests as told by your doctor. GET HELP IF:  You are bleeding for more than 1 week.  You feel dizzy at times. GET HELP RIGHT AWAY IF:   You pass out.  You have to change pads every 15 to 30 minutes.  You have belly pain.  You have a fever.  You become sweaty or weak.  You are passing large blood clots from the vagina.  You feel sick to your stomach (nauseous) and throw up (vomit). MAKE SURE YOU:  Understand these instructions.  Will watch your condition.  Will get help right away if you are not doing well or get worse. Document Released: 09/28/2009 Document Revised: 12/06/2013 Document Reviewed: 06/30/2013 [facility] Patient Information 2015 [place], [place].  This information is not intended to replace advice given to you by your health care provider. Make sure you discuss any questions you have with your health care provider.  You Can Quit Smoking If you are ready to quit smoking or are thinking about it, congratulations! You have chosen to help yourself be healthier and live longer! There are lots of different ways to quit smoking. Nicotine gum, nicotine patches, a nicotine inhaler, or nicotine nasal spray can help with physical craving. Hypnosis, support groups, and medicines help break the habit of smoking. TIPS TO GET OFF AND STAY OFF CIGARETTES  Learn to predict your moods. Do not let a bad situation be your excuse to have a cigarette. Some situations in your life might tempt you to have a cigarette.  Ask friends and co-workers not to smoke around you.  Make your home smoke-free.  Never have "just one" cigarette. It leads to wanting another and another. Remind yourself of your decision to quit.  On a card, make a list of your reasons for not smoking. Read it at least the same number of times a day as you have a cigarette. Tell yourself everyday, "I do not want to smoke. I choose not to smoke."  Ask someone at home or work to help you with your plan to quit smoking.  Have something planned after you eat or have a cup of coffee. Take a walk or get other exercise to perk you up.  This will help to keep you from overeating.  Try a relaxation exercise to calm you down and decrease your stress. Remember, you may be tense and nervous the first two weeks after you quit. This will pass.  Find new activities to keep your hands busy. Play with a pen, coin, or rubber band. Doodle or draw things on paper.  Brush your teeth right after eating. This will help cut down the craving for the taste of tobacco after meals. You can try mouthwash too.  Try gum, breath mints, or diet candy to keep something in your mouth. IF YOU SMOKE AND WANT TO QUIT:  Do not  stock up on cigarettes. Never buy a carton. Wait until one pack is finished before you buy another.  Never carry cigarettes with you at work or at home.  Keep cigarettes as far away from you as possible. Leave them with someone else.  Never carry matches or a lighter with you.  Ask yourself, "Do I need this cigarette or is this just a reflex?"  Bet with someone that you can quit. Put cigarette money in a piggy bank every morning. If you smoke, you give up the money. If you do not smoke, by the end of the week, you keep the money.  Keep trying. It takes 21 days to change a habit!  Talk to your doctor about using medicines to help you quit. These include nicotine replacement gum, lozenges, or skin patches. Document Released: 09/27/2009 Document Revised: 02/23/2012 Document Reviewed: 09/27/2009 [facility] Patient Information 2015 [place], [place]. This information is not intended to replace advice given to you by your health care provider. Make sure you discuss any questions you have with your health care provider.

## 2014-12-29 NOTE — Assessment & Plan Note (Signed)
Continues to smoke.  Contemplative stage.  High stress -Counseled on cessation -Recommended scheduling appointment with Dr Mingo Amber for management of stress/anxiety/depression.

## 2014-12-29 NOTE — Assessment & Plan Note (Addendum)
Patient reports some vaginal spotting.  Intermittent SOB with exertion -Repeat CBC today.  Will contact patient with results -patient counseled on red flag signs -Patient not a candidate for OCP therapy, current smoker contemplative stage -Recommend close follow up with Dr Kennon Rounds for any possible surgical intervention

## 2015-01-01 ENCOUNTER — Encounter: Payer: Self-pay | Admitting: Family Medicine

## 2015-01-08 ENCOUNTER — Ambulatory Visit: Payer: BLUE CROSS/BLUE SHIELD | Admitting: Family Medicine

## 2015-01-15 ENCOUNTER — Ambulatory Visit: Payer: BLUE CROSS/BLUE SHIELD | Admitting: Family Medicine

## 2015-01-18 ENCOUNTER — Encounter: Payer: Self-pay | Admitting: Family Medicine

## 2015-01-18 ENCOUNTER — Ambulatory Visit (INDEPENDENT_AMBULATORY_CARE_PROVIDER_SITE_OTHER): Payer: BLUE CROSS/BLUE SHIELD | Admitting: Family Medicine

## 2015-01-18 VITALS — BP 163/103 | HR 81 | Temp 98.5°F | Ht 70.0 in | Wt 173.0 lb

## 2015-01-18 DIAGNOSIS — I10 Essential (primary) hypertension: Secondary | ICD-10-CM

## 2015-01-18 DIAGNOSIS — D250 Submucous leiomyoma of uterus: Secondary | ICD-10-CM | POA: Diagnosis not present

## 2015-01-18 DIAGNOSIS — D62 Acute posthemorrhagic anemia: Secondary | ICD-10-CM | POA: Diagnosis not present

## 2015-01-18 DIAGNOSIS — N920 Excessive and frequent menstruation with regular cycle: Secondary | ICD-10-CM | POA: Diagnosis not present

## 2015-01-18 LAB — POCT HEMOGLOBIN: Hemoglobin: 8.4 g/dL — AB (ref 12.2–16.2)

## 2015-01-18 MED ORDER — FERROUS FUMARATE-FOLIC ACID 324-1 MG PO TABS: 1.0000 | ORAL_TABLET | Freq: Two times a day (BID) | ORAL | Status: DC

## 2015-01-18 MED ORDER — LISINOPRIL-HYDROCHLOROTHIAZIDE 20-25 MG PO TABS: 1.0000 | ORAL_TABLET | Freq: Every day | ORAL | Status: DC

## 2015-01-18 MED ORDER — MEGESTROL ACETATE 40 MG PO TABS: 80.0000 mg | ORAL_TABLET | Freq: Four times a day (QID) | ORAL | Status: DC

## 2015-01-18 NOTE — Assessment & Plan Note (Signed)
Submucosal and > 3 cm--will need TVH with salpingectomy. Risks include but are not limited to bleeding, infection, injury to surrounding structures, including bowel, bladder and ureters, blood clots, and death.  Likelihood of success is high.

## 2015-01-18 NOTE — Assessment & Plan Note (Signed)
Continue iron supplementation

## 2015-01-18 NOTE — Assessment & Plan Note (Addendum)
No significant improvement.  Will need definitive treatment. Continue Megace and Ibuprofen. Not a candidate for COC's due to age and smoking status.

## 2015-01-18 NOTE — Addendum Note (Signed)
Addended by: Donnamae Jude on: 01/18/2015 03:16 PM   Modules accepted: Level of Service

## 2015-01-18 NOTE — Patient Instructions (Addendum)
Uterine Fibroid A uterine fibroid is a growth (tumor) that occurs in your uterus. This type of tumor is not cancerous and does not spread out of the uterus. You can have one or many fibroids. Fibroids can vary in size, weight, and where they grow in the uterus. Some can become quite large. Most fibroids do not require medical treatment, but some can cause pain or heavy bleeding during and between periods. CAUSES  A fibroid is the result of a single uterine cell that keeps growing (unregulated), which is different than most cells in the human body. Most cells have a control mechanism that keeps them from reproducing without control.  SIGNS AND SYMPTOMS   Bleeding.  Pelvic pain and pressure.  Bladder problems due to the size of the fibroid.  Infertility and miscarriages depending on the size and location of the fibroid. DIAGNOSIS  Uterine fibroids are diagnosed through a physical exam. Your health care provider may feel the lumpy tumors during a pelvic exam. Ultrasonography may be done to get information regarding size, location, and number of tumors.  TREATMENT   Your health care provider may recommend watchful waiting. This involves getting the fibroid checked by your health care provider to see if it grows or shrinks.   Hormone treatment or an intrauterine device (IUD) may be prescribed.   Surgery may be needed to remove the fibroids (myomectomy) or the uterus (hysterectomy). This depends on your situation. When fibroids interfere with fertility and a woman wants to become pregnant, a health care provider may recommend having the fibroids removed.  [place] care depends on how you were treated. In general:   Keep all follow-up appointments with your health care provider.   Only take over-the-counter or prescription medicines as directed by your health care provider. If you were prescribed a hormone treatment, take the hormone medicines exactly as directed. Do not  take aspirin. It can cause bleeding.   Talk to your health care provider about taking iron pills.  If your periods are troublesome but not so heavy, lie down with your feet raised slightly above your heart. Place cold packs on your lower abdomen.   If your periods are heavy, write down the number of pads or tampons you use per month. Bring this information to your health care provider.   Include green vegetables in your diet.  SEEK IMMEDIATE MEDICAL CARE IF:  You have pelvic pain or cramps not controlled with medicines.   You have a sudden increase in pelvic pain.   You have an increase in bleeding between and during periods.   You have excessive periods and soak tampons or pads in a half hour or less.  You feel lightheaded or have fainting episodes. Document Released: 11/28/2000 Document Revised: 09/21/2013 Document Reviewed: 06/30/2013 [facility] Patient Information 2015 [place], [place]. This information is not intended to replace advice given to you by your health care provider. Make sure you discuss any questions you have with your health care provider. Hysterectomy Information  A hysterectomy is a surgery in which your uterus is removed. This surgery may be done to treat various medical problems. After the surgery, you will no longer have menstrual periods. The surgery will also make you unable to become pregnant (sterile). The fallopian tubes and ovaries can be removed (bilateral salpingo-oophorectomy) during this surgery as well.  REASONS FOR A HYSTERECTOMY  Persistent, abnormal bleeding.  Lasting (chronic) pelvic pain or infection.  The lining of the uterus (endometrium) starts growing outside  the uterus (endometriosis).  The endometrium starts growing in the muscle of the uterus (adenomyosis).  The uterus falls down into the vagina (pelvic organ prolapse).  Noncancerous growths in the uterus (uterine fibroids) that cause symptoms.  Precancerous cells.  Cervical  cancer or uterine cancer. TYPES OF HYSTERECTOMIES  Supracervical hysterectomy--In this type, the top part of the uterus is removed, but not the cervix.  Total hysterectomy--The uterus and cervix are removed.  Radical hysterectomy--The uterus, the cervix, and the fibrous tissue that holds the uterus in place in the pelvis (parametrium) are removed. WAYS A HYSTERECTOMY CAN BE PERFORMED  Abdominal hysterectomy--A large surgical cut (incision) is made in the abdomen. The uterus is removed through this incision.  Vaginal hysterectomy--An incision is made in the vagina. The uterus is removed through this incision. There are no abdominal incisions.  Conventional laparoscopic hysterectomy--Three or four small incisions are made in the abdomen. A thin, lighted tube with a camera (laparoscope) is inserted into one of the incisions. Other tools are put through the other incisions. The uterus is cut into small pieces. The small pieces are removed through the incisions, or they are removed through the vagina.  Laparoscopically assisted vaginal hysterectomy (LAVH)--Three or four small incisions are made in the abdomen. Part of the surgery is performed laparoscopically and part vaginally. The uterus is removed through the vagina.  Robot-assisted laparoscopic hysterectomy--A laparoscope and other tools are inserted into 3 or 4 small incisions in the abdomen. A computer-controlled device is used to give the surgeon a 3D image and to help control the surgical instruments. This allows for more precise movements of surgical instruments. The uterus is cut into small pieces and removed through the incisions or removed through the vagina. RISKS AND COMPLICATIONS  Possible complications associated with this procedure include:  Bleeding and risk of blood transfusion. Tell your health care provider if you do not want to receive any blood products.  Blood clots in the legs or lung.  Infection.  Injury to  surrounding organs.  Problems or side effects related to anesthesia.  Conversion to an abdominal hysterectomy from one of the other techniques. WHAT TO EXPECT AFTER A HYSTERECTOMY  You will be given pain medicine.  You will need to have someone with you for the first 3-5 days after you go home.  You will need to follow up with your surgeon in 2-4 weeks after surgery to evaluate your progress.  You may have early menopause symptoms such as hot flashes, night sweats, and insomnia.  If you had a hysterectomy for a problem that was not cancer or not a condition that could lead to cancer, then you no longer need Pap tests. However, even if you no longer need a Pap test, a regular exam is a good idea to make sure no other problems are starting. Document Released: 05/27/2001 Document Revised: 09/21/2013 Document Reviewed: 08/08/2013 [facility] Patient Information 2015 [place], [place]. This information is not intended to replace advice given to you by your health care provider. Make sure you discuss any questions you have with your health care provider.

## 2015-01-18 NOTE — Progress Notes (Addendum)
    Subjective:    Patient ID: Krista Foster is a 44 y.o. female presenting with Menorrhagia  on 01/18/2015  HPI: Still bleeding despite 80 mg qid of Megace.  Found to have a submucosal fibroid, found on pelvic sono.  EMB and pap were normal. Admitted to [facility] for blood transfusion.  Hgb was 8.3 at discharge.  Missed work last week with bleeding and significant cramping.Taking BP medication, but BP is still elevated.  Review of Systems  Constitutional: Positive for fatigue. Negative for fever and chills.  Respiratory: Negative for shortness of breath.   Cardiovascular: Negative for chest pain.  Gastrointestinal: Positive for abdominal pain. Negative for nausea and vomiting.  Genitourinary: Positive for vaginal bleeding. Negative for dysuria.  Musculoskeletal: Negative for back pain.  Skin: Negative for rash.  Neurological: Positive for light-headedness.      Objective:    BP 163/103 mmHg  Pulse 81  Temp(Src) 98.5 F (36.9 C) (Oral)  Ht 5\' 10"  (1.778 m)  Wt 173 lb (78.472 kg)  BMI 24.82 kg/m2  LMP 12/24/2013 Physical Exam  Constitutional: She is oriented to person, place, and time. She appears well-developed and well-nourished. No distress.  HENT:  Head: Normocephalic and atraumatic.  Eyes: No scleral icterus.  Neck: Neck supple.  Cardiovascular: Normal rate.   Pulmonary/Chest: Effort normal.  Abdominal: Soft.  Neurological: She is alert and oriented to person, place, and time.  Skin: Skin is warm and dry.  Psychiatric: She has a normal mood and affect.   Pelvic sono:  IMPRESSION: 3.4 cm submucosal fibroid in the anterior corpus which indents the endometrial cavity.  Normal appearance of both ovaries. No adnexal mass identified.  Hgb 8.4 today    Assessment & Plan:   Problem List Items Addressed This Visit      Unprioritized   HYPERTENSION, BENIGN SYSTEMIC   Relevant Medications   lisinopril-hydrochlorothiazide (PRINZIDE,ZESTORETIC) 20-25 MG per tablet   MENORRHAGIA - Primary    No significant improvement.  Will need definitive treatment. Continue Megace and Ibuprofen. Not a candidate for COC's due to age and smoking status.      Relevant Medications   megestrol (MEGACE) tablet   Other Relevant Orders   CBC   Hemoglobin   Acute blood loss anemia    Continue iron supplementation      Relevant Medications   Ferrous Fumarate-Folic Acid 830-9 MG TABS   Other Relevant Orders   CBC   Hemoglobin   Fibroid uterus    Submucosal and > 3 cm--will need TVH with salpingectomy. Risks include but are not limited to bleeding, infection, injury to surrounding structures, including bowel, bladder and ureters, blood clots, and death.  Likelihood of success is high.           Return in about 4 weeks (around 02/15/2015).

## 2015-01-19 LAB — CBC
HCT: 30.5 % — ABNORMAL LOW (ref 36.0–46.0)
HEMOGLOBIN: 9 g/dL — AB (ref 12.0–15.0)
MCH: 20.2 pg — ABNORMAL LOW (ref 26.0–34.0)
MCHC: 29.5 g/dL — AB (ref 30.0–36.0)
MCV: 68.4 fL — AB (ref 78.0–100.0)
MPV: 9.8 fL (ref 8.6–12.4)
PLATELETS: 395 10*3/uL (ref 150–400)
RBC: 4.46 MIL/uL (ref 3.87–5.11)
RDW: 23 % — ABNORMAL HIGH (ref 11.5–15.5)
WBC: 8.4 10*3/uL (ref 4.0–10.5)

## 2015-01-21 ENCOUNTER — Encounter: Payer: Self-pay | Admitting: Family Medicine

## 2015-01-21 NOTE — H&P (Addendum)
  Krista Foster is an 44 y.o. G3P3 female.    Chief Complaint: Heavy vaginal Bleeding  HPI: Patient has unremitting vaginal bleeding despite high dose Megace.  She is not a candidate for OCP'Foster due to age and smoking status.  Her w/u includes pelvic sono which shows a > 3.5 submucosal fibroid. She has a negative pap and EMB. Initially bled down to a hgb of 5 and required blood transfusion.  Past Medical History  Diagnosis Date  . Hypertension   . Anemia     Past Surgical History  Procedure Laterality Date  . Tubal ligation      No family history on file. Social History:  reports that she has been smoking Cigarettes.  She has a 9 pack-year smoking history. She has never used smokeless tobacco. She reports that she drinks alcohol. She reports that she does not use illicit drugs.  Allergies: No Known Allergies  No current facility-administered medications on file prior to encounter.   Current Outpatient Prescriptions on File Prior to Encounter  Medication Sig Dispense Refill  . Ferrous Fumarate-Folic Acid 716-9 MG TABS Take 1 tablet by mouth 2 (two) times daily. 60 each 3  . fluticasone (FLONASE) 50 MCG/ACT nasal spray Place 2 sprays into both nostrils daily. 16 g 0  . lisinopril-hydrochlorothiazide (PRINZIDE,ZESTORETIC) 20-25 MG per tablet Take 1 tablet by mouth daily. 30 tablet 3  . megestrol (MEGACE) 40 MG tablet Take 2 tablets (80 mg total) by mouth 4 (four) times daily. 120 tablet 3  . omeprazole (PRILOSEC) 20 MG capsule Take 1 capsule (20 mg total) by mouth daily. 90 capsule 1  . senna (SENOKOT) 8.6 MG TABS tablet Take 1 tablet (8.6 mg total) by mouth daily as needed for mild constipation. 120 each 0    Pertinent items are noted in HPI.  General appearance: alert, cooperative and appears older than stated age Head: Normocephalic, without obvious abnormality, atraumatic Neck: supple, symmetrical, trachea midline Lungs: normal effort Heart: regular rate and rhythm Abdomen:  soft, non-tender; bowel sounds normal; no masses,  no organomegaly Extremities: extremities normal, atraumatic, no cyanosis or edema Skin: Skin color, texture, turgor normal. No rashes or lesions Neurologic: Grossly normal   Lab Results  Component Value Date   WBC 8.4 01/18/2015   HGB 8.4* 01/18/2015   HCT 30.5* 01/18/2015   MCV 68.4* 01/18/2015   PLT 395 01/18/2015   Lab Results  Component Value Date   PREGTESTUR Negative 12/20/2014     Assessment Active Problems:   TOBACCO USER   HYPERTENSION, BENIGN SYSTEMIC   MENORRHAGIA   Acute blood loss anemia   Fibroid uterus  Plan For [facility] with salpingectomy Risks include but are not limited to bleeding, infection, injury to surrounding structures, including bowel, bladder and ureters, blood clots, and death.  Likelihood of success is high.  Krista Foster 01/21/2015, 10:47 AM

## 2015-01-26 ENCOUNTER — Encounter: Payer: Self-pay | Admitting: Family Medicine

## 2015-01-26 NOTE — Progress Notes (Signed)
Placed in PCP box for completion 

## 2015-01-26 NOTE — Progress Notes (Signed)
Patient need form completed to have surgical procedure to be performed by Dr. Kennon Rounds.  Pt will pick up when ready.

## 2015-01-26 NOTE — Progress Notes (Signed)
Completed and placed in Krista Foster's box. 

## 2015-01-30 NOTE — Progress Notes (Signed)
Returned called to pt to make sure she received the message.  Pt stated she picked up the her form but it was incomplete.  The PCP only put down one week out post hysterectomy.  Pt stated that should be 4-6 weeks out of work and a box was not checked.  Pt was going to bring form back to be corrected.  Derl Barrow, RN

## 2015-01-30 NOTE — Progress Notes (Signed)
Left voice message for pt informing her that the form is complete and ready for pick up.  Derl Barrow, RN

## 2015-01-31 NOTE — Progress Notes (Signed)
Form has been picked up by patient.

## 2015-01-31 NOTE — Progress Notes (Signed)
LVM for patient to call back. I have sat the form up front for pick up

## 2015-02-02 ENCOUNTER — Emergency Department (HOSPITAL_COMMUNITY)
Admission: EM | Admit: 2015-02-02 | Discharge: 2015-02-02 | Disposition: A | Discharge status: Home / Self Care | Payer: BLUE CROSS/BLUE SHIELD | Attending: Emergency Medicine | Admitting: Emergency Medicine

## 2015-02-02 ENCOUNTER — Encounter (HOSPITAL_COMMUNITY): Payer: Self-pay | Admitting: *Deleted

## 2015-02-02 DIAGNOSIS — R109 Unspecified abdominal pain: Secondary | ICD-10-CM | POA: Diagnosis not present

## 2015-02-02 DIAGNOSIS — R5381 Other malaise: Secondary | ICD-10-CM | POA: Diagnosis not present

## 2015-02-02 DIAGNOSIS — D649 Anemia, unspecified: Secondary | ICD-10-CM | POA: Diagnosis not present

## 2015-02-02 DIAGNOSIS — Z720 Tobacco use: Secondary | ICD-10-CM | POA: Diagnosis not present

## 2015-02-02 DIAGNOSIS — Z3202 Encounter for pregnancy test, result negative: Secondary | ICD-10-CM | POA: Insufficient documentation

## 2015-02-02 DIAGNOSIS — I10 Essential (primary) hypertension: Secondary | ICD-10-CM | POA: Insufficient documentation

## 2015-02-02 DIAGNOSIS — Z9851 Tubal ligation status: Secondary | ICD-10-CM | POA: Insufficient documentation

## 2015-02-02 DIAGNOSIS — M6281 Muscle weakness (generalized): Secondary | ICD-10-CM | POA: Diagnosis not present

## 2015-02-02 DIAGNOSIS — Z79899 Other long term (current) drug therapy: Secondary | ICD-10-CM | POA: Insufficient documentation

## 2015-02-02 DIAGNOSIS — N939 Abnormal uterine and vaginal bleeding, unspecified: Secondary | ICD-10-CM | POA: Insufficient documentation

## 2015-02-02 LAB — CBC WITH DIFFERENTIAL/PLATELET
BASOS PCT: 0 % (ref 0–1)
Basophils Absolute: 0 10*3/uL (ref 0.0–0.1)
EOS ABS: 0.1 10*3/uL (ref 0.0–0.7)
Eosinophils Relative: 2 % (ref 0–5)
HEMATOCRIT: 32 % — AB (ref 36.0–46.0)
Hemoglobin: 9.3 g/dL — ABNORMAL LOW (ref 12.0–15.0)
LYMPHS ABS: 2.2 10*3/uL (ref 0.7–4.0)
LYMPHS PCT: 32 % (ref 12–46)
MCH: 20.7 pg — ABNORMAL LOW (ref 26.0–34.0)
MCHC: 29.1 g/dL — ABNORMAL LOW (ref 30.0–36.0)
MCV: 71.3 fL — ABNORMAL LOW (ref 78.0–100.0)
Monocytes Absolute: 0.6 10*3/uL (ref 0.1–1.0)
Monocytes Relative: 8 % (ref 3–12)
NEUTROS ABS: 4 10*3/uL (ref 1.7–7.7)
Neutrophils Relative %: 58 % (ref 43–77)
Platelets: 325 10*3/uL (ref 150–400)
RBC: 4.49 MIL/uL (ref 3.87–5.11)
RDW: 21.3 % — AB (ref 11.5–15.5)
WBC: 6.9 10*3/uL (ref 4.0–10.5)

## 2015-02-02 LAB — URINALYSIS, ROUTINE W REFLEX MICROSCOPIC
BILIRUBIN URINE: NEGATIVE
Glucose, UA: NEGATIVE mg/dL
Ketones, ur: NEGATIVE mg/dL
Nitrite: NEGATIVE
PROTEIN: 30 mg/dL — AB
Specific Gravity, Urine: 1.011 (ref 1.005–1.030)
Urobilinogen, UA: 0.2 mg/dL (ref 0.0–1.0)
pH: 7.5 (ref 5.0–8.0)

## 2015-02-02 LAB — POC URINE PREG, ED: Preg Test, Ur: NEGATIVE

## 2015-02-02 LAB — COMPREHENSIVE METABOLIC PANEL
ALK PHOS: 45 U/L (ref 39–117)
ALT: 9 U/L (ref 0–35)
ANION GAP: 9 (ref 5–15)
AST: 14 U/L (ref 0–37)
Albumin: 4 g/dL (ref 3.5–5.2)
BUN: 7 mg/dL (ref 6–23)
CALCIUM: 9.5 mg/dL (ref 8.4–10.5)
CO2: 24 mmol/L (ref 19–32)
Chloride: 102 mmol/L (ref 96–112)
Creatinine, Ser: 0.71 mg/dL (ref 0.50–1.10)
GFR calc non Af Amer: >90 mL/min (ref >90)
Glucose, Bld: 100 mg/dL — ABNORMAL HIGH (ref 70–99)
POTASSIUM: 4 mmol/L (ref 3.5–5.1)
SODIUM: 135 mmol/L (ref 135–145)
TOTAL PROTEIN: 6.9 g/dL (ref 6.0–8.3)
Total Bilirubin: 0.2 mg/dL — ABNORMAL LOW (ref 0.3–1.2)

## 2015-02-02 LAB — URINE MICROSCOPIC-ADD ON

## 2015-02-02 LAB — TYPE AND SCREEN
ABO/RH(D): O POS
Antibody Screen: NEGATIVE

## 2015-02-02 LAB — PROTIME-INR
INR: 0.95 (ref 0.00–1.49)
PROTHROMBIN TIME: 12.8 s (ref 11.6–15.2)

## 2015-02-02 MED ORDER — SODIUM CHLORIDE 0.9 % IV BOLUS (SEPSIS)
1000.0000 mL | Freq: Once | INTRAVENOUS | Status: AC
  Administered 2015-02-02: 1000 mL via INTRAVENOUS

## 2015-02-02 NOTE — ED Provider Notes (Signed)
CSN: [id number]     Arrival date & time 02/02/15  2021 History   First MD Initiated Contact with Patient 02/02/15 2110     Chief Complaint  Patient presents with  . Vaginal Bleeding     (Consider location/radiation/quality/duration/timing/severity/associated sxs/prior Treatment) Patient is a 44 y.o. female presenting with vaginal bleeding.  Vaginal Bleeding Quality:  Dark red Severity:  Severe Onset quality:  Gradual Duration:  2 months Timing:  Constant Progression:  Worsening Chronicity:  New Number of pads used:  30 pads a day Possible pregnancy: no   Context: at rest   Relieved by:  Nothing Worsened by:  Nothing tried Associated symptoms: abdominal pain   Associated symptoms: no dysuria, no fever and no nausea     Past Medical History  Diagnosis Date  . Hypertension   . Anemia    Past Surgical History  Procedure Laterality Date  . Tubal ligation     No family history on file. History  Substance Use Topics  . Smoking status: Current Every Day Smoker -- 0.30 packs/day for 30 years    Types: Cigarettes  . Smokeless tobacco: Never Used  . Alcohol Use: Yes     Comment: occasional   OB History    Gravida Para Term Preterm AB TAB SAB Ectopic Multiple Living   3 3        3      Review of Systems  Constitutional: Negative for fever.  Gastrointestinal: Positive for abdominal pain. Negative for nausea.  Genitourinary: Positive for vaginal bleeding. Negative for dysuria.  All other systems reviewed and are negative.     Allergies  Review of patient's allergies indicates no known allergies.  Home Medications   Prior to Admission medications   Medication Sig Start Date End Date Taking? Authorizing Provider  ENSURE (ENSURE) Take 237 mLs by mouth 2 (two) times daily between meals.   Yes Historical Provider, MD  Ferrous Fumarate-Folic Acid 169-6 MG TABS Take 1 tablet by mouth 2 (two) times daily. 01/18/15  Yes Donnamae Jude, MD  lisinopril-hydrochlorothiazide  (PRINZIDE,ZESTORETIC) 20-25 MG per tablet Take 1 tablet by mouth daily. 01/18/15 12/17/17 Yes Donnamae Jude, MD  megestrol (MEGACE) 40 MG tablet Take 2 tablets (80 mg total) by mouth 4 (four) times daily. 01/18/15  Yes Donnamae Jude, MD  fluticasone (FLONASE) 50 MCG/ACT nasal spray Place 2 sprays into both nostrils daily. Patient not taking: Reported on 01/25/2015 12/22/14   [place] N Rumley, DO   BP 90/77 mmHg  Pulse 78  Temp(Src) 98 F (36.7 C) (Oral)  Resp 12  Ht 5\' 10"  (1.778 m)  Wt 170 lb (77.111 kg)  BMI 24.39 kg/m2  SpO2 100%  LMP 02/02/2015 Physical Exam  Constitutional: She is oriented to person, place, and time. She appears well-developed and well-nourished.  HENT:  Head: Normocephalic and atraumatic.  Right Ear: External ear normal.  Left Ear: External ear normal.  Eyes: Conjunctivae and EOM are normal. Pupils are equal, round, and reactive to light.  Neck: Normal range of motion. Neck supple.  Cardiovascular: Normal rate, regular rhythm, normal heart sounds and intact distal pulses.   Pulmonary/Chest: Effort normal and breath sounds normal.  Abdominal: Soft. Bowel sounds are normal. There is no tenderness.  Genitourinary: Cervix exhibits discharge (very minimal bleeding per cervical os). Cervix exhibits no motion tenderness and no friability. There is bleeding in the vagina.  Musculoskeletal: Normal range of motion.  Neurological: She is alert and oriented to person, place, and time.  Skin: Skin is warm and dry.  Vitals reviewed.   ED Course  Procedures (including critical care time) Labs Review Labs Reviewed  CBC WITH DIFFERENTIAL/PLATELET - Abnormal; Notable for the following:    Hemoglobin 9.3 (*)    HCT 32.0 (*)    MCV 71.3 (*)    MCH 20.7 (*)    MCHC 29.1 (*)    RDW 21.3 (*)    All other components within normal limits  COMPREHENSIVE METABOLIC PANEL - Abnormal; Notable for the following:    Glucose, Bld 100 (*)    Total Bilirubin 0.2 (*)    All other components  within normal limits  URINALYSIS, ROUTINE W REFLEX MICROSCOPIC - Abnormal; Notable for the following:    Color, Urine RED (*)    Hgb urine dipstick LARGE (*)    Protein, ur 30 (*)    Leukocytes, UA TRACE (*)    All other components within normal limits  URINE MICROSCOPIC-ADD ON - Abnormal; Notable for the following:    Bacteria, UA FEW (*)    All other components within normal limits  PROTIME-INR  POC URINE PREG, ED  TYPE AND SCREEN    Imaging Review No results found.   EKG Interpretation None      MDM   Final diagnoses:  Vaginal bleeding    44 y.o. female with pertinent PMH of vaginal bleeding over last 2 months with scheduled hysterectomy next month presents with malaise, generalized weakness, and continued vaginal bleeding.  Wu as above with improved anemia from last check.  Minimal bleeding from os.  Feel pt stable to dc home with gyn fu.  Given standard return precautions    I have reviewed all laboratory and imaging studies if ordered as above  1. Vaginal bleeding         Debby Freiberg, MD 02/02/15 [phone]

## 2015-02-02 NOTE — Discharge Instructions (Signed)
Dysfunctional Uterine Bleeding Normally, menstrual periods begin between ages 11 to 17 in young women. A normal menstrual cycle/period may begin every 23 days up to 35 days and lasts from 1 to 7 days. Around 12 to 14 days before your menstrual period starts, ovulation (ovary produces an egg) occurs. When counting the time between menstrual periods, count from the first day of bleeding of the previous period to the first day of bleeding of the next period. Dysfunctional (abnormal) uterine bleeding is bleeding that is different from a normal menstrual period. Your periods may come earlier or later than usual. They may be lighter, have blood clots or be heavier. You may have bleeding between periods, or you may skip one period or more. You may have bleeding after sexual intercourse, bleeding after menopause, or no menstrual period. CAUSES   Pregnancy (normal, miscarriage, tubal).  IUDs (intrauterine device, birth control).  Birth control pills.  Hormone treatment.  Menopause.  Infection of the cervix.  Blood clotting problems.  Infection of the inside lining of the uterus.  Endometriosis, inside lining of the uterus growing in the pelvis and other female organs.  Adhesions (scar tissue) inside the uterus.  Obesity or severe weight loss.  Uterine polyps inside the uterus.  Cancer of the vagina, cervix, or uterus.  Ovarian cysts or polycystic ovary syndrome.  Medical problems (diabetes, thyroid disease).  Uterine fibroids (noncancerous tumor).  Problems with your female hormones.  Endometrial hyperplasia, very thick lining and enlarged cells inside of the uterus.  Medicines that interfere with ovulation.  Radiation to the pelvis or abdomen.  Chemotherapy. DIAGNOSIS   Your doctor will discuss the history of your menstrual periods, medicines you are taking, changes in your weight, stress in your life, and any medical problems you may have.  Your doctor will do a physical  and pelvic examination.  Your doctor may want to perform certain tests to make a diagnosis, such as:  Pap test.  Blood tests.  Cultures for infection.  CT scan.  Ultrasound.  Hysteroscopy.  Laparoscopy.  MRI.  Hysterosalpingography.  D and C.  Endometrial biopsy. TREATMENT  Treatment will depend on the cause of the dysfunctional uterine bleeding (DUB). Treatment may include:  Observing your menstrual periods for a couple of months.  Prescribing medicines for medical problems, including:  Antibiotics.  Hormones.  Birth control pills.  Removing an IUD (intrauterine device, birth control).  Surgery:  D and C (scrape and remove tissue from inside the uterus).  Laparoscopy (examine inside the abdomen with a lighted tube).  Uterine ablation (destroy lining of the uterus with electrical current, laser, heat, or freezing).  Hysteroscopy (examine cervix and uterus with a lighted tube).  Hysterectomy (remove the uterus). HOME CARE INSTRUCTIONS   If medicines were prescribed, take exactly as directed. Do not change or switch medicines without consulting your caregiver.  Long term heavy bleeding may result in iron deficiency. Your caregiver may have prescribed iron pills. They help replace the iron that your body lost from heavy bleeding. Take exactly as directed.  Do not take aspirin or medicines that contain aspirin one week before or during your menstrual period. Aspirin may make the bleeding worse.  If you need to change your sanitary pad or tampon more than once every 2 hours, stay in bed with your feet elevated and a cold pack on your lower abdomen. Rest as much as possible, until the bleeding stops or slows down.  Eat well-balanced meals. Eat foods high in iron. Examples   are:  Leafy green vegetables.  Whole-grain breads and cereals.  Eggs.  Meat.  Liver.  Do not try to lose weight until the abnormal bleeding has stopped and your blood iron level is  back to normal. Do not lift more than ten pounds or do strenuous activities when you are bleeding.  For a couple of months, make note on your calendar, marking the start and ending of your period, and the type of bleeding (light, medium, heavy, spotting, clots or missed periods). This is for your caregiver to better evaluate your problem. SEEK MEDICAL CARE IF:   You develop nausea (feeling sick to your stomach) and vomiting, dizziness, or diarrhea while you are taking your medicine.  You are getting lightheaded or weak.  You have any problems that may be related to the medicine you are taking.  You develop pain with your DUB.  You want to remove your IUD.  You want to stop or change your birth control pills or hormones.  You have any type of abnormal bleeding mentioned above.  You are over 44 years old and have not had a menstrual period yet.  You are 44 years old and you are still having menstrual periods.  You have any of the symptoms mentioned above.  You develop a rash. SEEK IMMEDIATE MEDICAL CARE IF:   An oral temperature above 102 F (38.9 C) develops.  You develop chills.  You are changing your sanitary pad or tampon more than once an hour.  You develop abdominal pain.  You pass out or faint. Document Released: 11/28/2000 Document Revised: 02/23/2012 Document Reviewed: 10/30/2009 Exit[employer] Patient Information 2015 [employer]. This information is not intended to replace advice given to you by your health care provider. Make sure you discuss any questions you have with your health care provider.  

## 2015-02-02 NOTE — ED Notes (Signed)
The pt has had vaginal bleeding since December. 2015.  She feels dizzy for the past 3 days.  She thinks she needs a blood transfusioin.  lmp now  Hysterectomy scheduled on march qs

## 2015-02-09 ENCOUNTER — Encounter (HOSPITAL_COMMUNITY): Payer: Self-pay

## 2015-02-09 ENCOUNTER — Encounter (HOSPITAL_COMMUNITY)
Admission: RE | Admit: 2015-02-09 | Discharge: 2015-02-09 | Disposition: A | Discharge status: Home / Self Care | Payer: BLUE CROSS/BLUE SHIELD | Source: Ambulatory Visit | Attending: Family Medicine | Admitting: Family Medicine

## 2015-02-09 DIAGNOSIS — D250 Submucous leiomyoma of uterus: Secondary | ICD-10-CM | POA: Diagnosis not present

## 2015-02-09 DIAGNOSIS — Z01818 Encounter for other preprocedural examination: Secondary | ICD-10-CM | POA: Insufficient documentation

## 2015-02-09 DIAGNOSIS — N920 Excessive and frequent menstruation with regular cycle: Secondary | ICD-10-CM | POA: Insufficient documentation

## 2015-02-09 HISTORY — DX: Anodontia: K00.0

## 2015-02-09 HISTORY — DX: Reserved for inherently not codable concepts without codable children: IMO0001

## 2015-02-09 HISTORY — DX: Headache, unspecified: R51.9

## 2015-02-09 HISTORY — DX: Headache: R51

## 2015-02-09 HISTORY — DX: Complete loss of teeth, unspecified cause, unspecified class: K08.109

## 2015-02-09 LAB — CBC
HEMATOCRIT: 30.2 % — AB (ref 36.0–46.0)
Hemoglobin: 8.6 g/dL — ABNORMAL LOW (ref 12.0–15.0)
MCH: 20.8 pg — ABNORMAL LOW (ref 26.0–34.0)
MCHC: 28.5 g/dL — AB (ref 30.0–36.0)
MCV: 73.1 fL — ABNORMAL LOW (ref 78.0–100.0)
Platelets: 348 10*3/uL (ref 150–400)
RBC: 4.13 MIL/uL (ref 3.87–5.11)
RDW: 19.6 % — ABNORMAL HIGH (ref 11.5–15.5)
WBC: 6.7 10*3/uL (ref 4.0–10.5)

## 2015-02-09 LAB — BASIC METABOLIC PANEL
Anion gap: 6 (ref 5–15)
BUN: 9 mg/dL (ref 6–23)
CO2: 24 mmol/L (ref 19–32)
Calcium: 9.2 mg/dL (ref 8.4–10.5)
Chloride: 109 mmol/L (ref 96–112)
Creatinine, Ser: 0.64 mg/dL (ref 0.50–1.10)
GFR calc Af Amer: >90 mL/min (ref >90)
GFR calc non Af Amer: >90 mL/min (ref >90)
Glucose, Bld: 106 mg/dL — ABNORMAL HIGH (ref 70–99)
POTASSIUM: 4.2 mmol/L (ref 3.5–5.1)
Sodium: 139 mmol/L (ref 135–145)

## 2015-02-09 NOTE — Patient Instructions (Signed)
Your procedure is scheduled on:02/13/15  Enter through the Main Entrance at :6am Pick up desk phone and dial [phone] and inform us of your arrival.  Please call [phone] if you have any problems the morning of surgery.  Remember: Do not eat food or drink liquids, including water, after midnight:Monday    You may brush your teeth the morning of surgery.  Take these meds the morning of surgery with a sip of water:blood pressure pill either the night before surgery or the morning of surgery.  DO NOT wear jewelry, eye make-up, lipstick,body lotion, or dark fingernail polish.  (Polished toes are ok) You may wear deodorant.  If you are to be admitted after surgery, leave suitcase in car until your room has been assigned. Patients discharged on the day of surgery will not be allowed to drive home. Wear loose fitting, comfortable clothes for your ride home.

## 2015-02-13 ENCOUNTER — Ambulatory Visit (HOSPITAL_COMMUNITY): Payer: BLUE CROSS/BLUE SHIELD | Admitting: Anesthesiology

## 2015-02-13 ENCOUNTER — Encounter (HOSPITAL_COMMUNITY)
Admission: RE | Disposition: A | Discharge status: Home / Self Care | Payer: Self-pay | Source: Ambulatory Visit | Attending: Family Medicine

## 2015-02-13 ENCOUNTER — Ambulatory Visit (HOSPITAL_COMMUNITY)
Admission: RE | Admit: 2015-02-13 | Discharge: 2015-02-14 | Disposition: A | Discharge status: Home / Self Care | Payer: BLUE CROSS/BLUE SHIELD | Source: Ambulatory Visit | Attending: Family Medicine | Admitting: Family Medicine

## 2015-02-13 ENCOUNTER — Encounter (HOSPITAL_COMMUNITY): Payer: Self-pay | Admitting: Anesthesiology

## 2015-02-13 DIAGNOSIS — F17200 Nicotine dependence, unspecified, uncomplicated: Secondary | ICD-10-CM | POA: Diagnosis present

## 2015-02-13 DIAGNOSIS — N920 Excessive and frequent menstruation with regular cycle: Secondary | ICD-10-CM | POA: Diagnosis not present

## 2015-02-13 DIAGNOSIS — D62 Acute posthemorrhagic anemia: Secondary | ICD-10-CM | POA: Insufficient documentation

## 2015-02-13 DIAGNOSIS — D250 Submucous leiomyoma of uterus: Secondary | ICD-10-CM

## 2015-02-13 DIAGNOSIS — I10 Essential (primary) hypertension: Secondary | ICD-10-CM | POA: Insufficient documentation

## 2015-02-13 DIAGNOSIS — F17210 Nicotine dependence, cigarettes, uncomplicated: Secondary | ICD-10-CM | POA: Insufficient documentation

## 2015-02-13 DIAGNOSIS — D259 Leiomyoma of uterus, unspecified: Secondary | ICD-10-CM | POA: Diagnosis not present

## 2015-02-13 DIAGNOSIS — N939 Abnormal uterine and vaginal bleeding, unspecified: Secondary | ICD-10-CM | POA: Insufficient documentation

## 2015-02-13 HISTORY — PX: BILATERAL SALPINGECTOMY: SHX5743

## 2015-02-13 HISTORY — PX: VAGINAL HYSTERECTOMY: SHX2639

## 2015-02-13 LAB — TYPE AND SCREEN
ABO/RH(D): O POS
Antibody Screen: NEGATIVE

## 2015-02-13 LAB — BASIC METABOLIC PANEL
Anion gap: 6 (ref 5–15)
BUN: 8 mg/dL (ref 6–23)
CO2: 28 mmol/L (ref 19–32)
Calcium: 9.6 mg/dL (ref 8.4–10.5)
Chloride: 105 mmol/L (ref 96–112)
Creatinine, Ser: 0.75 mg/dL (ref 0.50–1.10)
GFR calc Af Amer: >90 mL/min (ref >90)
GFR calc non Af Amer: >90 mL/min (ref >90)
Glucose, Bld: 116 mg/dL — ABNORMAL HIGH (ref 70–99)
POTASSIUM: 4.5 mmol/L (ref 3.5–5.1)
SODIUM: 139 mmol/L (ref 135–145)

## 2015-02-13 LAB — ABO/RH: ABO/RH(D): O POS

## 2015-02-13 LAB — PREGNANCY, URINE: Preg Test, Ur: NEGATIVE

## 2015-02-13 SURGERY — HYSTERECTOMY, VAGINAL
Anesthesia: General | Site: Vagina

## 2015-02-13 MED ORDER — NALOXONE HCL 0.4 MG/ML IJ SOLN: 0.4000 mg | INTRAMUSCULAR | Status: DC | PRN

## 2015-02-13 MED ORDER — PHENYLEPHRINE 40 MCG/ML (10ML) SYRINGE FOR IV PUSH (FOR BLOOD PRESSURE SUPPORT)
PREFILLED_SYRINGE | INTRAVENOUS | Status: AC
  Filled 2015-02-13: qty 10

## 2015-02-13 MED ORDER — KETOROLAC TROMETHAMINE 30 MG/ML IJ SOLN
INTRAMUSCULAR | Status: DC | PRN
  Administered 2015-02-13: 30 mg via INTRAVENOUS

## 2015-02-13 MED ORDER — MENTHOL 3 MG MT LOZG: 1.0000 | LOZENGE | OROMUCOSAL | Status: DC | PRN

## 2015-02-13 MED ORDER — CEFAZOLIN SODIUM-DEXTROSE 2-3 GM-% IV SOLR
2.0000 g | INTRAVENOUS | Status: AC
  Administered 2015-02-13: 2 g via INTRAVENOUS

## 2015-02-13 MED ORDER — ONDANSETRON HCL 4 MG/2ML IJ SOLN
INTRAMUSCULAR | Status: AC
  Filled 2015-02-13: qty 2

## 2015-02-13 MED ORDER — EPHEDRINE SULFATE 50 MG/ML IJ SOLN
INTRAMUSCULAR | Status: DC | PRN
  Administered 2015-02-13: 10 mg via INTRAVENOUS

## 2015-02-13 MED ORDER — FENTANYL CITRATE 0.05 MG/ML IJ SOLN
INTRAMUSCULAR | Status: AC
  Filled 2015-02-13: qty 2

## 2015-02-13 MED ORDER — OXYCODONE-ACETAMINOPHEN 5-325 MG PO TABS
1.0000 | ORAL_TABLET | ORAL | Status: DC | PRN
  Administered 2015-02-14: 1 via ORAL
  Administered 2015-02-14: 2 via ORAL
  Filled 2015-02-13: qty 2
  Filled 2015-02-13: qty 1

## 2015-02-13 MED ORDER — CEFAZOLIN SODIUM-DEXTROSE 2-3 GM-% IV SOLR
INTRAVENOUS | Status: AC
  Filled 2015-02-13: qty 50

## 2015-02-13 MED ORDER — FENTANYL CITRATE 0.05 MG/ML IJ SOLN
INTRAMUSCULAR | Status: DC | PRN
  Administered 2015-02-13 (×7): 50 ug via INTRAVENOUS

## 2015-02-13 MED ORDER — HYDROMORPHONE 0.3 MG/ML IV SOLN
INTRAVENOUS | Status: DC
  Administered 2015-02-13: 10:00:00 via INTRAVENOUS
  Administered 2015-02-13: 0.999 mg via INTRAVENOUS
  Administered 2015-02-13: 1.99 mg via INTRAVENOUS
  Administered 2015-02-13: 0.599 mg via INTRAVENOUS
  Administered 2015-02-14: 1.19 mg via INTRAVENOUS
  Filled 2015-02-13: qty 25

## 2015-02-13 MED ORDER — MIDAZOLAM HCL 2 MG/2ML IJ SOLN
INTRAMUSCULAR | Status: DC | PRN
  Administered 2015-02-13: 2 mg via INTRAVENOUS

## 2015-02-13 MED ORDER — HYDROMORPHONE HCL 1 MG/ML IJ SOLN
INTRAMUSCULAR | Status: DC | PRN
  Administered 2015-02-13 (×2): 0.5 mg via INTRAVENOUS

## 2015-02-13 MED ORDER — PHENYLEPHRINE HCL 10 MG/ML IJ SOLN
INTRAMUSCULAR | Status: DC | PRN
  Administered 2015-02-13 (×2): 80 ug via INTRAVENOUS

## 2015-02-13 MED ORDER — ESTRADIOL 0.1 MG/GM VA CREA
TOPICAL_CREAM | VAGINAL | Status: DC | PRN
  Administered 2015-02-13: 1 via VAGINAL

## 2015-02-13 MED ORDER — PROPOFOL 10 MG/ML IV BOLUS
INTRAVENOUS | Status: AC
  Filled 2015-02-13: qty 20

## 2015-02-13 MED ORDER — NEOSTIGMINE METHYLSULFATE 10 MG/10ML IV SOLN
INTRAVENOUS | Status: AC
  Filled 2015-02-13: qty 1

## 2015-02-13 MED ORDER — DIPHENHYDRAMINE HCL 50 MG/ML IJ SOLN: 12.5000 mg | Freq: Four times a day (QID) | INTRAMUSCULAR | Status: DC | PRN

## 2015-02-13 MED ORDER — MEPERIDINE HCL 25 MG/ML IJ SOLN: 6.2500 mg | INTRAMUSCULAR | Status: DC | PRN

## 2015-02-13 MED ORDER — SODIUM CHLORIDE 0.9 % IJ SOLN: 9.0000 mL | INTRAMUSCULAR | Status: DC | PRN

## 2015-02-13 MED ORDER — ROCURONIUM BROMIDE 100 MG/10ML IV SOLN
INTRAVENOUS | Status: AC
  Filled 2015-02-13: qty 1

## 2015-02-13 MED ORDER — PROPOFOL 10 MG/ML IV BOLUS
INTRAVENOUS | Status: DC | PRN
  Administered 2015-02-13: 200 mg via INTRAVENOUS

## 2015-02-13 MED ORDER — DIPHENHYDRAMINE HCL 12.5 MG/5ML PO ELIX: 12.5000 mg | ORAL_SOLUTION | Freq: Four times a day (QID) | ORAL | Status: DC | PRN

## 2015-02-13 MED ORDER — LACTATED RINGERS IV SOLN
INTRAVENOUS | Status: DC
  Administered 2015-02-13 (×2): via INTRAVENOUS

## 2015-02-13 MED ORDER — DEXTROSE IN LACTATED RINGERS 5 % IV SOLN
INTRAVENOUS | Status: DC
  Administered 2015-02-13 (×2): via INTRAVENOUS

## 2015-02-13 MED ORDER — DEXAMETHASONE SODIUM PHOSPHATE 10 MG/ML IJ SOLN
INTRAMUSCULAR | Status: DC | PRN
  Administered 2015-02-13: 4 mg via INTRAVENOUS

## 2015-02-13 MED ORDER — SCOPOLAMINE 1 MG/3DAYS TD PT72
1.0000 | MEDICATED_PATCH | Freq: Once | TRANSDERMAL | Status: DC
Start: 2015-02-13 — End: 2015-02-13
  Administered 2015-02-13: 1.5 mg via TRANSDERMAL

## 2015-02-13 MED ORDER — LIDOCAINE-EPINEPHRINE 1 %-1:100000 IJ SOLN
INTRAMUSCULAR | Status: AC
  Filled 2015-02-13: qty 1

## 2015-02-13 MED ORDER — NICOTINE 7 MG/24HR TD PT24
7.0000 mg | MEDICATED_PATCH | Freq: Every day | TRANSDERMAL | Status: DC
  Administered 2015-02-13 – 2015-02-14 (×2): 7 mg via TRANSDERMAL
  Filled 2015-02-13 (×2): qty 1

## 2015-02-13 MED ORDER — LACTATED RINGERS IV SOLN: INTRAVENOUS | Status: DC

## 2015-02-13 MED ORDER — EPHEDRINE 5 MG/ML INJ
INTRAVENOUS | Status: AC
  Filled 2015-02-13: qty 10

## 2015-02-13 MED ORDER — ONDANSETRON HCL 4 MG/2ML IJ SOLN
INTRAMUSCULAR | Status: DC | PRN
  Administered 2015-02-13: 4 mg via INTRAVENOUS

## 2015-02-13 MED ORDER — LIDOCAINE-EPINEPHRINE 1 %-1:100000 IJ SOLN
INTRAMUSCULAR | Status: DC | PRN
  Administered 2015-02-13: 20 mL

## 2015-02-13 MED ORDER — LIDOCAINE HCL (CARDIAC) 20 MG/ML IV SOLN
INTRAVENOUS | Status: AC
  Filled 2015-02-13: qty 5

## 2015-02-13 MED ORDER — LIDOCAINE HCL (CARDIAC) 20 MG/ML IV SOLN
INTRAVENOUS | Status: DC | PRN
  Administered 2015-02-13: 80 mg via INTRAVENOUS

## 2015-02-13 MED ORDER — GLYCOPYRROLATE 0.2 MG/ML IJ SOLN
INTRAMUSCULAR | Status: AC
  Filled 2015-02-13: qty 3

## 2015-02-13 MED ORDER — HYDROMORPHONE HCL 1 MG/ML IJ SOLN
0.2500 mg | INTRAMUSCULAR | Status: DC | PRN
  Administered 2015-02-13 (×2): 0.5 mg via INTRAVENOUS

## 2015-02-13 MED ORDER — SCOPOLAMINE 1 MG/3DAYS TD PT72
MEDICATED_PATCH | TRANSDERMAL | Status: AC
  Administered 2015-02-13: 1.5 mg via TRANSDERMAL
  Filled 2015-02-13: qty 1

## 2015-02-13 MED ORDER — HYDROMORPHONE HCL 1 MG/ML IJ SOLN
INTRAMUSCULAR | Status: AC
  Filled 2015-02-13: qty 1

## 2015-02-13 MED ORDER — ESTRADIOL 0.1 MG/GM VA CREA
TOPICAL_CREAM | VAGINAL | Status: AC
  Filled 2015-02-13: qty 42.5

## 2015-02-13 MED ORDER — ONDANSETRON HCL 4 MG/2ML IJ SOLN: 4.0000 mg | Freq: Four times a day (QID) | INTRAMUSCULAR | Status: DC | PRN

## 2015-02-13 MED ORDER — KETOROLAC TROMETHAMINE 30 MG/ML IJ SOLN
INTRAMUSCULAR | Status: AC
  Filled 2015-02-13: qty 1

## 2015-02-13 MED ORDER — FENTANYL CITRATE 0.05 MG/ML IJ SOLN
INTRAMUSCULAR | Status: AC
  Filled 2015-02-13: qty 5

## 2015-02-13 MED ORDER — IBUPROFEN 600 MG PO TABS: 600.0000 mg | ORAL_TABLET | Freq: Four times a day (QID) | ORAL | Status: DC | PRN

## 2015-02-13 MED ORDER — PROMETHAZINE HCL 25 MG/ML IJ SOLN
6.2500 mg | INTRAMUSCULAR | Status: DC | PRN
Start: 2015-02-13 — End: 2015-02-13

## 2015-02-13 MED ORDER — DEXAMETHASONE SODIUM PHOSPHATE 4 MG/ML IJ SOLN
INTRAMUSCULAR | Status: AC
  Filled 2015-02-13: qty 1

## 2015-02-13 MED ORDER — MIDAZOLAM HCL 2 MG/2ML IJ SOLN
INTRAMUSCULAR | Status: AC
  Filled 2015-02-13: qty 2

## 2015-02-13 MED ORDER — HYDROMORPHONE HCL 1 MG/ML IJ SOLN
INTRAMUSCULAR | Status: AC
  Administered 2015-02-13: 0.5 mg via INTRAVENOUS
  Filled 2015-02-13: qty 1

## 2015-02-13 SURGICAL SUPPLY — 26 items
CANISTER SUCT 3000ML (MISCELLANEOUS) ×3 IMPLANT
CLOTH BEACON ORANGE TIMEOUT ST (SAFETY) ×3 IMPLANT
CONT PATH 16OZ SNAP LID 3702 (MISCELLANEOUS) ×3 IMPLANT
DECANTER SPIKE VIAL GLASS SM (MISCELLANEOUS) ×3 IMPLANT
DRSG TELFA 3X8 NADH (GAUZE/BANDAGES/DRESSINGS) ×3 IMPLANT
GAUZE PACKING 2X5 YD STRL (GAUZE/BANDAGES/DRESSINGS) IMPLANT
GLOVE BIOGEL PI IND STRL 6.5 (GLOVE) ×1 IMPLANT
GLOVE BIOGEL PI IND STRL 7.0 (GLOVE) ×1 IMPLANT
GLOVE BIOGEL PI INDICATOR 6.5 (GLOVE) ×2
GLOVE BIOGEL PI INDICATOR 7.0 (GLOVE) ×2
GLOVE ECLIPSE 7.0 STRL STRAW (GLOVE) ×6 IMPLANT
GOWN STRL REUS W/TWL LRG LVL3 (GOWN DISPOSABLE) ×15 IMPLANT
NEEDLE HYPO 22GX1.5 SAFETY (NEEDLE) IMPLANT
NEEDLE SPNL 18GX3.5 QUINCKE PK (NEEDLE) ×3 IMPLANT
NS IRRIG 1000ML POUR BTL (IV SOLUTION) ×3 IMPLANT
PACK VAGINAL WOMENS (CUSTOM PROCEDURE TRAY) ×3 IMPLANT
PAD OB MATERNITY 4.3X12.25 (PERSONAL CARE ITEMS) ×3 IMPLANT
SUT VIC AB 0 CT1 18XCR BRD8 (SUTURE) ×3 IMPLANT
SUT VIC AB 0 CT1 27 (SUTURE) ×4
SUT VIC AB 0 CT1 27XBRD ANBCTR (SUTURE) ×2 IMPLANT
SUT VIC AB 0 CT1 8-18 (SUTURE) ×6
SUT VICRYL 0 TIES 12 18 (SUTURE) ×3 IMPLANT
SYR 20CC LL (SYRINGE) ×3 IMPLANT
TOWEL OR 17X24 6PK STRL BLUE (TOWEL DISPOSABLE) ×6 IMPLANT
TRAY FOLEY CATH 14FR (SET/KITS/TRAYS/PACK) ×3 IMPLANT
WATER STERILE IRR 1000ML POUR (IV SOLUTION) ×3 IMPLANT

## 2015-02-13 NOTE — Op Note (Signed)
Preoperative diagnosis: Abnormal vaginal bleeding  Postoperative diagnosis: Same  Procedure: Transvaginal hysterectomy and bilateral salpingectomy  Surgeon: Standley Dakins. Kennon Rounds, M.D.  Assistant: Lavonia Drafts, MD  Anesthesia: MAC ,Lyn Hollingshead, MD, MD  Findings: Globular uterus, normal tubes and ovaries.  Estimated blood loss: 50 cc  Specimen: Uterus and tubes to pathology  Reason for procedure: Patient had h/o bleeding, requiring transfusion x 1.  US showed submucosal fibroid > 3 cm and normal EMB. Failed Megace and not a candidate for OC's.  Declined endometrial ablation.The patient desired definitive treatment.  Risks of  hysterectomy reviewed.  Risks include but are not limited to bleeding, infection, injury to surrounding structures, including bowel, bladder and ureters, blood clots, and death.  Likelihood of success of surgery is high.   Procedure: Patient was taken to the OR where she was placed in dorsal lithotomy in [place]. She was prepped and draped in the usual sterile fashion. A timeout was performed. The patient received 1 g of Ancef prior to procedure. The patient had SCDs in place.  A speculum was placed inside the vagina. The cervix was visualized and grasped with 2 doublle-tooth tenacula. 20 cc of 1% lidocaine with epinephrine were injected paracervically. A knife was used to make a circumferential incision around the vagina. An opened sponge was used to dissect the vagina off the cervix. The posterior peritoneum was entered sharply with Mayo scissors. The posterior peritoneum was tagged to the vaginal cuff with a single stitch. The anterior peritoneal cavity was not entered with careful dissection of the bladder off the underlying cervix and pushing it up and out of the way. A Heaney clamp was used to clamp first the left uterosacral ligament and cardinal which was then cut and Haney suture ligated with 0 Vicryl stitch, the stitch was held. Similarly the right  uterosacral ligament was clamped cut and suture ligated. Sequential bites up the broad to the uterine arteries were taken with clamp, cut and suture ligation until the tubo-ovarian pedicles were encountered. The uterus was then inverted and the bladder reflection completed. The left utero-ovarian pedicle grasped with a Heaney clamp x 2. The right utero-ovarian pedicle was similarly grasped with the Heaney clamp x 2. The right left ovaries were easily visualized. The rightt tube was grasped with Babcock clamp and a Kelly clamp placed behind this. Tube  removed and  ligated with a suture ligature. The right tube was similarly grasped with a Babcock clamp and a Kelly clamp used to clamp behind this. The tube was removed on the left side and was ligated with a  suture ligature. The tubo-ovarian pedicles were secured with a free tie, followed by a suture ligature. Inspection of all pedicles revealed adequate hemostasis. There was some bleeding noted at the vaginal cuff on the left side. The vagina was closed with 0 Vicryl suture in a locked running fashion with care taken to incorporate the uterosacral pedicles. Excellent hemostasis was noted at the end of the case. The vaginal cuff was inspected there was minimal bleeding noted.  A Foley catheter is placed inside her bladder. Clear, yellow urine was noted. All instrument needle and lap counts were correct x 2. Patient was awakened taken to recovery room in stable condition.  Donnamae Jude, MD 02/13/2015, 8:27 AM

## 2015-02-13 NOTE — Transfer of Care (Signed)
Immediate Anesthesia Transfer of Care Note  Patient: Krista Foster  Procedure(s) Performed: Procedure(s): HYSTERECTOMY VAGINAL (N/A) BILATERAL SALPINGECTOMY (N/A)  Patient Location: PACU  Anesthesia Type:General  Level of Consciousness: awake, alert  and oriented  Airway & Oxygen Therapy: Patient Spontanous Breathing and Patient connected to nasal cannula oxygen  Post-op Assessment: Report given to RN, Post -op Vital signs reviewed and stable and Patient moving all extremities  Post vital signs: Reviewed and stable  Last Vitals:  Filed Vitals:   02/13/15 0556  Pulse: 98  Temp: 36.7 C  Resp: 18    Complications: No apparent anesthesia complications

## 2015-02-13 NOTE — Anesthesia Postprocedure Evaluation (Signed)
Anesthesia Post Note  Patient: Krista Foster  Procedure(s) Performed: Procedure(s) (LRB): HYSTERECTOMY VAGINAL (N/A) BILATERAL SALPINGECTOMY (N/A)  Anesthesia type: General  Patient location: PACU  Post pain: Pain level controlled  Post assessment: Post-op Vital signs reviewed  Last Vitals:  Filed Vitals:   02/13/15 0926  BP:   Pulse: 77  Temp: 36.6 C  Resp:     Post vital signs: Reviewed  Level of consciousness: sedated  Complications: No apparent anesthesia complications

## 2015-02-13 NOTE — Anesthesia Preprocedure Evaluation (Signed)
Anesthesia Evaluation    Reviewed: Allergy & Precautions, H&P , NPO status , Patient's Chart, lab work & pertinent test results, reviewed documented beta blocker date and time   Airway Mallampati: II  TM Distance: >3 FB Neck ROM: full    Dental no notable dental hx. (+) Teeth Intact   Pulmonary Current Smoker,    Pulmonary exam normal       Cardiovascular hypertension, Pt. on medications     Neuro/Psych negative neurological ROS     GI/Hepatic negative GI ROS, Neg liver ROS,   Endo/Other  negative endocrine ROS  Renal/GU negative Renal ROS     Musculoskeletal   Abdominal Normal abdominal exam  (+)   Peds  Hematology   Anesthesia Other Findings   Reproductive/Obstetrics negative OB ROS                             Anesthesia Physical Anesthesia Plan  ASA: II  Anesthesia Plan: General   Post-op Pain Management:    Induction: Intravenous  Airway Management Planned: LMA  Additional Equipment:   Intra-op Plan:   Post-operative Plan:   Informed Consent: I have reviewed the patients History and Physical, chart, labs and discussed the procedure including the risks, benefits and alternatives for the proposed anesthesia with the patient or authorized representative who has indicated his/her understanding and acceptance.     Plan Discussed with: CRNA  Anesthesia Plan Comments:         Anesthesia Quick Evaluation

## 2015-02-13 NOTE — Interval H&P Note (Signed)
History and Physical Interval Note:  02/13/2015 7:05 AM  Krista Foster  has presented today for surgery, with the diagnosis of MENORRHAGIA,FIBROIDS UTERUS  The various methods of treatment have been discussed with the patient and family. After consideration of risks, benefits and other options for treatment, the patient has consented to  Procedure(s): HYSTERECTOMY VAGINAL (N/A) BILATERAL SALPINGECTOMY (N/A) as a surgical intervention .  The patient's history has been reviewed, patient examined, no change in status, stable for surgery.  I have reviewed the patient's chart and labs.  Questions were answered to the patient's satisfaction. We discussed possibility of blood transfusion, other risks of surgery.  Pt. Understands and is agreeable to proceeding.    [place]

## 2015-02-13 NOTE — Anesthesia Procedure Notes (Signed)
Procedure Name: LMA Insertion Date/Time: 02/13/2015 7:16 AM Performed by: Tobin Chad Pre-anesthesia Checklist: Patient identified, Emergency Drugs available, Suction available and Patient being monitored Patient Re-evaluated:Patient Re-evaluated prior to inductionOxygen Delivery Method: Circle system utilized Preoxygenation: Pre-oxygenation with 100% oxygen Intubation Type: IV induction Ventilation: Mask ventilation without difficulty LMA: LMA inserted Number of attempts: 1 Placement Confirmation: positive ETCO2,  CO2 detector and breath sounds checked- equal and bilateral Tube secured with: Tape Dental Injury: Teeth and Oropharynx as per pre-operative assessment

## 2015-02-14 ENCOUNTER — Encounter (HOSPITAL_COMMUNITY): Payer: Self-pay | Admitting: Family Medicine

## 2015-02-14 DIAGNOSIS — N939 Abnormal uterine and vaginal bleeding, unspecified: Secondary | ICD-10-CM | POA: Diagnosis not present

## 2015-02-14 LAB — CBC
HCT: 29.1 % — ABNORMAL LOW (ref 36.0–46.0)
HEMOGLOBIN: 8.3 g/dL — AB (ref 12.0–15.0)
MCH: 20.6 pg — ABNORMAL LOW (ref 26.0–34.0)
MCHC: 28.5 g/dL — ABNORMAL LOW (ref 30.0–36.0)
MCV: 72.4 fL — ABNORMAL LOW (ref 78.0–100.0)
Platelets: 328 10*3/uL (ref 150–400)
RBC: 4.02 MIL/uL (ref 3.87–5.11)
RDW: 18.8 % — ABNORMAL HIGH (ref 11.5–15.5)
WBC: 10.2 10*3/uL (ref 4.0–10.5)

## 2015-02-14 MED ORDER — OXYCODONE-ACETAMINOPHEN 5-325 MG PO TABS: 1.0000 | ORAL_TABLET | ORAL | Status: DC | PRN

## 2015-02-14 NOTE — Discharge Instructions (Signed)
Vaginal Hysterectomy, Care After Refer to this sheet in the next few weeks. These instructions provide you with information on caring for yourself after your procedure. Your health care provider may also give you more specific instructions. Your treatment has been planned according to current medical practices, but problems sometimes occur. Call your health care provider if you have any problems or questions after your procedure.  WHAT TO EXPECT AFTER THE PROCEDURE After your procedure, it is typical to have the following:  Pain.  Feeling tired.  Poor appetite.  Less interest in sex. HOME CARE INSTRUCTIONS  It takes 4-6 weeks to recover from this surgery. Make sure you follow all your health care provider's instructions. Home care instructions may include:  Take pain medicines only as directed by your health care provider. Do not take over-the-counter pain medicines without checking with your health care provider first.  Change your bandage as directed by your health care provider.  Return to your health care provider to have your sutures taken out.  Take showers instead of baths for 2-3 weeks. Ask your health care provider when it is safe to start showering.  Do not douche, use tampons, or have sexual intercourse for at least 6 weeks or until your health care provider says you can.   Follow your health care provider's advice about exercise, lifting, driving, and general activities.  Get plenty of rest and sleep.   Do not lift anything heavier than a gallon of milk (about 10 lb [4.5 kg]) for the first month after surgery.  You can resume your normal diet if your health care provider says it is okay.   Do not drink alcohol until your health care provider says you can.   If you are constipated, ask your health care provider if you can take a mild laxative.  Eating foods high in fiber may also help with constipation. Eat plenty of raw fruits and vegetables, whole grains, and  beans.  Drink enough fluids to keep your urine clear or pale yellow.   Try to have someone at home with you for the first 1-2 weeks to help around the house.  Keep all follow-up appointments. SEEK MEDICAL CARE IF:   You have chills or fever.  You feel dizzy or light-headed.   You have pain or bleeding when you urinate.   You have persistent diarrhea.   You have persistent nausea and vomiting.   You have abnormal vaginal discharge.   You have a rash.   You have any type of abnormal reaction or develop an allergy to your medicine.   Your pain medicine is not helping.  SEEK IMMEDIATE MEDICAL CARE IF:   You have a fever and your symptoms suddenly get worse.  You have severe abdominal pain.  You have chest pain.  You have shortness of breath.  You faint.  You have pain, swelling, or redness of your leg.  You have heavy vaginal bleeding with blood clots. MAKE SURE YOU:  Understand these instructions.  Will watch your condition.  Will get help right away if you are not doing well or get worse. Document Released: 06/20/2005 Document Revised: 12/06/2013 Document Reviewed: 09/23/2013 [facility] Patient Information 2015 [place], [place]. This information is not intended to replace advice given to you by your health care provider. Make sure you discuss any questions you have with your health care provider.

## 2015-02-14 NOTE — Progress Notes (Signed)
Patient given discharge instructions and follow up care.  Patient verbalized understanding.  No distress noted.  Patient discharged home independently with family members.    Noralee Stain RN

## 2015-02-14 NOTE — Anesthesia Postprocedure Evaluation (Signed)
Anesthesia Post Note  Patient: Krista Foster  Procedure(s) Performed: Procedure(s) (LRB): HYSTERECTOMY VAGINAL (N/A) BILATERAL SALPINGECTOMY (N/A)  Anesthesia type: General  Patient location: Mother/Baby  Post pain: Pain level controlled  Post assessment: Post-op Vital signs reviewed  Last Vitals:  Filed Vitals:   02/14/15 0518  BP: 108/69  Pulse: 80  Temp: 36.9 C  Resp: 20    Post vital signs: Reviewed  Level of consciousness: awake and alert   Complications: No apparent anesthesia complications

## 2015-02-14 NOTE — Discharge Summary (Signed)
Gynecology Physician Postoperative Discharge Summary  Patient ID: Krista Foster MRN: [id number] DOB/AGE: Jun 26, 1971 44 y.o.  Admit Date: 02/13/2015 Discharge Date: 02/14/2015  Preoperative Diagnoses: Principal Problem:   Abnormal uterine bleeding Active Problems:   TOBACCO USER   HYPERTENSION, BENIGN SYSTEMIC   MENORRHAGIA   Acute blood loss anemia   Fibroid uterus   Procedures: Procedure(s) (LRB): HYSTERECTOMY VAGINAL (N/A) BILATERAL SALPINGECTOMY (N/A)  CBC Latest Ref Rng 02/14/2015 02/09/2015 02/02/2015  WBC 4.0 - 10.5 K/uL 10.2 6.7 6.9  Hemoglobin 12.0 - 15.0 g/dL 8.3(L) 8.6(L) 9.3(L)  Hematocrit 36.0 - 46.0 % 29.1(L) 30.2(L) 32.0(L)  Platelets 150 - 400 K/uL 328 348 325    Hospital Course:  Krista Foster is a 44 y.o. G3P3  admitted for scheduled surgery.  She underwent the procedures as mentioned above, her operation was uncomplicated. For further details about surgery, please refer to the operative report. Patient had an uncomplicated postoperative course. By time of discharge on POD#1, her pain was controlled on oral pain medications; she was ambulating, voiding without difficulty, tolerating regular diet and passing flatus. She was deemed stable for discharge to home.   Discharge Exam: Blood pressure 108/69, pulse 80, temperature 98.5 F (36.9 C), temperature source Oral, resp. rate 20, height 5\' 10"  (1.778 m), weight 174 lb (78.926 kg), SpO2 98 %. General appearance: alert and no distress  Resp: normal effort  Cardio: regular rate and rhythm  GI: soft, non-tender; bowel sounds normal; no masses, no organomegaly.  Extremities: extremities normal, atraumatic, no cyanosis or edema and Homans sign is negative, no sign of DVT  Discharged Condition: Stable  Disposition: 01-Home or Self Care  Discharge Instructions    Call MD for:  persistant nausea and vomiting    Complete by:  As directed      Call MD for:  severe uncontrolled pain    Complete by:  As directed      Call  MD for:  temperature >100.4    Complete by:  As directed      Diet - low sodium heart healthy    Complete by:  As directed      Increase activity slowly    Complete by:  As directed      Lifting restrictions    Complete by:  As directed   Nothing > 20 lbs x 4 wks     No wound care    Complete by:  As directed      Sexual Activity Restrictions    Complete by:  As directed   None x 6 wks, nothing per vagina, no baths            Medication List    STOP taking these medications        lisinopril-hydrochlorothiazide 20-25 MG per tablet  Commonly known as:  PRINZIDE,ZESTORETIC      TAKE these medications        ENSURE  Take 237 mLs by mouth 2 (two) times daily between meals.     Ferrous Fumarate-Folic Acid 387-5 MG Tabs  Take 1 tablet by mouth 2 (two) times daily.     fluticasone 50 MCG/ACT nasal spray  Commonly known as:  FLONASE  Place 2 sprays into both nostrils daily.     oxyCODONE-acetaminophen 5-325 MG per tablet  Commonly known as:  PERCOCET/ROXICET  Take 1-2 tablets by mouth every 4 (four) hours as needed for severe pain (moderate to severe pain (when tolerating fluids)).  Follow-up Information    Follow up with [facility] In 2 weeks.   Specialty:  Obstetrics and Gynecology   Why:  postop check, they will call you with appointment   Contact information:   [place] [phone]      Signed: Donnamae Jude 02/14/2015 8:38 AM

## 2015-02-14 NOTE — Addendum Note (Signed)
Addendum  created 02/14/15 0809 by Talbot Grumbling, CRNA   Modules edited: Notes Section   Notes Section:  File: [id number]

## 2015-02-28 ENCOUNTER — Encounter: Payer: Self-pay | Admitting: Family Medicine

## 2015-02-28 ENCOUNTER — Ambulatory Visit (INDEPENDENT_AMBULATORY_CARE_PROVIDER_SITE_OTHER): Payer: BLUE CROSS/BLUE SHIELD | Admitting: Family Medicine

## 2015-02-28 VITALS — BP 154/98 | HR 77 | Ht 70.0 in | Wt 172.0 lb

## 2015-02-28 DIAGNOSIS — D62 Acute posthemorrhagic anemia: Secondary | ICD-10-CM | POA: Diagnosis not present

## 2015-02-28 DIAGNOSIS — Z09 Encounter for follow-up examination after completed treatment for conditions other than malignant neoplasm: Secondary | ICD-10-CM

## 2015-02-28 DIAGNOSIS — I10 Essential (primary) hypertension: Secondary | ICD-10-CM

## 2015-02-28 DIAGNOSIS — Z1239 Encounter for other screening for malignant neoplasm of breast: Secondary | ICD-10-CM | POA: Diagnosis not present

## 2015-02-28 MED ORDER — HYDROCHLOROTHIAZIDE 25 MG PO TABS: 25.0000 mg | ORAL_TABLET | Freq: Every day | ORAL | Status: DC

## 2015-02-28 NOTE — Progress Notes (Signed)
Here today for post op, self discontinued her blood pressure medication due to side effect of dizziness.  Discontinued her iron supplement, did not know if she was still supposed to take.  Had diarrhea for last two weeks. Taking ibuprofen pm at night.

## 2015-02-28 NOTE — Assessment & Plan Note (Signed)
Begin HCTZ alone--start with 1/2 tablet x 1 wk, if BP remains high increase to one tablet. F/u BP in 3 months.

## 2015-02-28 NOTE — Progress Notes (Signed)
    Subjective:    Patient ID: Krista Foster is a 44 y.o. female presenting with Routine Post Op  on 02/28/2015  HPI: Post op following TVH and salpingectomy--feels well--still having some pain. Needs mammogram. Placed back on Prinizide--but that made her feel too dizzy, so not taking--but BP is back up.  Review of Systems  Constitutional: Negative for fever and chills.  Respiratory: Negative for shortness of breath.   Cardiovascular: Negative for chest pain.  Gastrointestinal: Positive for abdominal pain. Negative for nausea and vomiting.  Genitourinary: Negative for dysuria.  Skin: Negative for rash.      Objective:    BP 154/98 mmHg  Pulse 77  Ht 5\' 10"  (1.778 m)  Wt 172 lb (78.019 kg)  BMI 24.68 kg/m2  LMP  (LMP Unknown) Physical Exam  Constitutional: She is oriented to person, place, and time. She appears well-developed and well-nourished. No distress.  HENT:  Head: Normocephalic and atraumatic.  Eyes: No scleral icterus.  Neck: Neck supple.  Cardiovascular: Normal rate.   Pulmonary/Chest: Effort normal.  Abdominal: Soft.  Genitourinary: Vagina normal.  Healing well, no mass  Neurological: She is alert and oriented to person, place, and time.  Skin: Skin is warm and dry.  Psychiatric: She has a normal mood and affect.        Assessment & Plan:   Problem List Items Addressed This Visit      Unprioritized   HYPERTENSION, BENIGN SYSTEMIC    Begin HCTZ alone--start with 1/2 tablet x 1 wk, if BP remains high increase to one tablet. F/u BP in 3 months.      Relevant Medications   hydrochlorothiazide tablet   Acute blood loss anemia    Improving        Other Visit Diagnoses    Postop check    -  Primary    release back to work in 2 wks    Screening for breast cancer        Relevant Orders    MM DIGITAL SCREENING BILATERAL       Return in about 4 weeks (around 03/28/2015).

## 2015-02-28 NOTE — Patient Instructions (Addendum)
Vaginal Hysterectomy, Care After Refer to this sheet in the next few weeks. These instructions provide you with information on caring for yourself after your procedure. Your health care provider may also give you more specific instructions. Your treatment has been planned according to current medical practices, but problems sometimes occur. Call your health care provider if you have any problems or questions after your procedure.  WHAT TO EXPECT AFTER THE PROCEDURE After your procedure, it is typical to have the following:  Pain.  Feeling tired.  Poor appetite.  Less interest in sex. HOME CARE INSTRUCTIONS  It takes 4-6 weeks to recover from this surgery. Make sure you follow all your health care provider's instructions. Home care instructions may include:  Take pain medicines only as directed by your health care provider. Do not take over-the-counter pain medicines without checking with your health care provider first.  Change your bandage as directed by your health care provider.  Return to your health care provider to have your sutures taken out.  Take showers instead of baths for 2-3 weeks. Ask your health care provider when it is safe to start showering.  Do not douche, use tampons, or have sexual intercourse for at least 6 weeks or until your health care provider says you can.   Follow your health care provider's advice about exercise, lifting, driving, and general activities.  Get plenty of rest and sleep.   Do not lift anything heavier than a gallon of milk (about 10 lb [4.5 kg]) for the first month after surgery.  You can resume your normal diet if your health care provider says it is okay.   Do not drink alcohol until your health care provider says you can.   If you are constipated, ask your health care provider if you can take a mild laxative.  Eating foods high in fiber may also help with constipation. Eat plenty of raw fruits and vegetables, whole grains, and  beans.  Drink enough fluids to keep your urine clear or pale yellow.   Try to have someone at home with you for the first 1-2 weeks to help around the house.  Keep all follow-up appointments. SEEK MEDICAL CARE IF:   You have chills or fever.  You have swelling, redness, or pain in the area of your incision that is getting worse.   You have pus coming from the incision.   You notice a bad smell coming from the incision or bandage.    You feel dizzy or light-headed.   You have pain or bleeding when you urinate.   You have persistent diarrhea.   You have persistent nausea and vomiting.   You have abnormal vaginal discharge.   You have a rash.   You have any type of abnormal reaction or develop an allergy to your medicine.   Your pain medicine is not helping.  SEEK IMMEDIATE MEDICAL CARE IF:   You have a fever and your symptoms suddenly get worse.  You have severe abdominal pain.  You have chest pain.  You have shortness of breath.  You faint.  You have pain, swelling, or redness of your leg.  You have heavy vaginal bleeding with blood clots. MAKE SURE YOU:  Understand these instructions.  Will watch your condition.  Will get help right away if you are not doing well or get worse. Document Released: 06/20/2005 Document Revised: 12/06/2013 Document Reviewed: 09/23/2013 [facility] Patient Information 2015 [place], [place]. This information is not intended to replace advice given to  you by your health care provider. Make sure you discuss any questions you have with your health care provider. DASH Eating Plan DASH stands for "Dietary Approaches to Stop Hypertension." The DASH eating plan is a healthy eating plan that has been shown to reduce high blood pressure (hypertension). Additional health benefits may include reducing the risk of type 2 diabetes mellitus, heart disease, and stroke. The DASH eating plan may also help with weight loss. WHAT DO I NEED TO  KNOW ABOUT THE DASH EATING PLAN? For the DASH eating plan, you will follow these general guidelines:  Choose foods with a percent daily value for sodium of less than 5% (as listed on the food label).  Use salt-free seasonings or herbs instead of table salt or sea salt.  Check with your health care provider or pharmacist before using salt substitutes.  Eat lower-sodium products, often labeled as "lower sodium" or "no salt added."  Eat fresh foods.  Eat more vegetables, fruits, and low-fat dairy products.  Choose whole grains. Look for the word "whole" as the first word in the ingredient list.  Choose fish and skinless chicken or [place] more often than red meat. Limit fish, poultry, and meat to 6 oz (170 g) each day.  Limit sweets, desserts, sugars, and sugary drinks.  Choose heart-healthy fats.  Limit cheese to 1 oz (28 g) per day.  Eat more home-cooked food and less restaurant, buffet, and fast food.  Limit fried foods.  Cook foods using methods other than frying.  Limit canned vegetables. If you do use them, rinse them well to decrease the sodium.  When eating at a restaurant, ask that your food be prepared with less salt, or no salt if possible. WHAT FOODS CAN I EAT? Seek help from a dietitian for individual calorie needs. Grains Whole grain or whole wheat bread. Brown rice. Whole grain or whole wheat pasta. Quinoa, bulgur, and whole grain cereals. Low-sodium cereals. Corn or whole wheat flour tortillas. Whole grain cornbread. Whole grain crackers. Low-sodium crackers. Vegetables Fresh or frozen vegetables (raw, steamed, roasted, or grilled). Low-sodium or reduced-sodium tomato and vegetable juices. Low-sodium or reduced-sodium tomato sauce and paste. Low-sodium or reduced-sodium canned vegetables.  Fruits All fresh, canned (in natural juice), or frozen fruits. Meat and Other Protein Products Ground beef (85% or leaner), grass-fed beef, or beef trimmed of fat. Skinless  chicken or [place]. Ground chicken or [place]. Pork trimmed of fat. All fish and seafood. Eggs. Dried beans, peas, or lentils. Unsalted nuts and seeds. Unsalted canned beans. Dairy Low-fat dairy products, such as skim or 1% milk, 2% or reduced-fat cheeses, low-fat ricotta or cottage cheese, or plain low-fat yogurt. Low-sodium or reduced-sodium cheeses. Fats and Oils Tub margarines without trans fats. Light or reduced-fat mayonnaise and salad dressings (reduced sodium). Avocado. Safflower, olive, or canola oils. Natural peanut or almond butter. Other Unsalted popcorn and pretzels. The items listed above may not be a complete list of recommended foods or beverages. Contact your dietitian for more options. WHAT FOODS ARE NOT RECOMMENDED? Grains White bread. White pasta. White rice. Refined cornbread. Bagels and croissants. Crackers that contain trans fat. Vegetables Creamed or fried vegetables. Vegetables in a cheese sauce. Regular canned vegetables. Regular canned tomato sauce and paste. Regular tomato and vegetable juices. Fruits Dried fruits. Canned fruit in light or heavy syrup. Fruit juice. Meat and Other Protein Products Fatty cuts of meat. Ribs, chicken wings, bacon, sausage, bologna, salami, chitterlings, fatback, hot dogs, bratwurst, and packaged luncheon meats. Salted nuts and  seeds. Canned beans with salt. Dairy Whole or 2% milk, cream, half-and-half, and cream cheese. Whole-fat or sweetened yogurt. Full-fat cheeses or blue cheese. Nondairy creamers and whipped toppings. Processed cheese, cheese spreads, or cheese curds. Condiments Onion and garlic salt, seasoned salt, table salt, and sea salt. Canned and packaged gravies. Worcestershire sauce. Tartar sauce. Barbecue sauce. Teriyaki sauce. Soy sauce, including reduced sodium. Steak sauce. Fish sauce. Oyster sauce. Cocktail sauce. Horseradish. Ketchup and mustard. Meat flavorings and tenderizers. Bouillon cubes. Hot sauce. Tabasco sauce.  Marinades. Taco seasonings. Relishes. Fats and Oils Butter, stick margarine, lard, shortening, ghee, and bacon fat. Coconut, palm kernel, or palm oils. Regular salad dressings. Other Pickles and olives. Salted popcorn and pretzels. The items listed above may not be a complete list of foods and beverages to avoid. Contact your dietitian for more information. WHERE CAN I FIND MORE INFORMATION? National Heart, Lung, and Blood Institute: travelstabloid.com Document Released: 11/20/2011 Document Revised: 04/17/2014 Document Reviewed: 10/05/2013 [facility] Patient Information 2015 [place], [place]. This information is not intended to replace advice given to you by your health care provider. Make sure you discuss any questions you have with your health care provider.

## 2015-02-28 NOTE — Assessment & Plan Note (Signed)
Improving.

## 2015-03-02 ENCOUNTER — Ambulatory Visit (HOSPITAL_COMMUNITY): Payer: BLUE CROSS/BLUE SHIELD | Attending: Family Medicine

## 2015-08-03 ENCOUNTER — Other Ambulatory Visit: Payer: Self-pay | Admitting: Family Medicine

## 2015-11-30 ENCOUNTER — Other Ambulatory Visit: Payer: Self-pay | Admitting: Family Medicine

## 2016-02-04 ENCOUNTER — Telehealth: Payer: Self-pay | Admitting: Family Medicine

## 2016-02-04 NOTE — Telephone Encounter (Signed)
Pt is calling to discuss her BP medication. She thinks that it is making her very tired. jw

## 2016-02-07 NOTE — Telephone Encounter (Signed)
Called and had to leave patient to return message.  I'm listed as this patient's PCP, but have never met her.  It looks like she was last seen here in Feb 2016, and last time she saw any physician in our system was march 2016.  She was anemic at that point and had hysterectomy, but has never had her iron or CBC measured since then.  I would recommend an appointment as this is more likely causing her fatigue than a blood pressure medicine.

## 2016-02-19 ENCOUNTER — Ambulatory Visit (INDEPENDENT_AMBULATORY_CARE_PROVIDER_SITE_OTHER): Payer: BLUE CROSS/BLUE SHIELD | Admitting: Family Medicine

## 2016-02-19 ENCOUNTER — Encounter: Payer: Self-pay | Admitting: Family Medicine

## 2016-02-19 VITALS — BP 123/88 | HR 82 | Temp 98.2°F | Ht 70.0 in | Wt 191.0 lb

## 2016-02-19 DIAGNOSIS — R5382 Chronic fatigue, unspecified: Secondary | ICD-10-CM

## 2016-02-19 LAB — CBC WITH DIFFERENTIAL/PLATELET
Basophils Absolute: 0 10*3/uL (ref 0.0–0.1)
Basophils Relative: 0 % (ref 0–1)
EOS ABS: 0.1 10*3/uL (ref 0.0–0.7)
EOS PCT: 1 % (ref 0–5)
HEMATOCRIT: 42 % (ref 36.0–46.0)
Hemoglobin: 14.3 g/dL (ref 12.0–15.0)
Lymphocytes Relative: 31 % (ref 12–46)
Lymphs Abs: 2.4 10*3/uL (ref 0.7–4.0)
MCH: 30.1 pg (ref 26.0–34.0)
MCHC: 34 g/dL (ref 30.0–36.0)
MCV: 88.4 fL (ref 78.0–100.0)
MONO ABS: 0.5 10*3/uL (ref 0.1–1.0)
MPV: 11.2 fL (ref 8.6–12.4)
Monocytes Relative: 7 % (ref 3–12)
Neutro Abs: 4.8 10*3/uL (ref 1.7–7.7)
Neutrophils Relative %: 61 % (ref 43–77)
PLATELETS: 229 10*3/uL (ref 150–400)
RBC: 4.75 MIL/uL (ref 3.87–5.11)
RDW: 13.9 % (ref 11.5–15.5)
WBC: 7.8 10*3/uL (ref 4.0–10.5)

## 2016-02-19 LAB — TSH: TSH: 1.75 mIU/L

## 2016-02-19 NOTE — Progress Notes (Signed)
Subjective:    Krista Foster is a 45 y.o. female who presents to [facility] today for fatigue:  1.  Fatigue:  Present for past month or so.  Reports that she has been up 3-4 times every night for nocturia for same period of time.  Goes to bed around 7:30 pm and is up at 5 AM everyday.  Works from 7 AM to 5 pm.  Previously had hysterectomy for vaginal bleeding.  Has not had FU CBC since then.  No weight/heat intolerance.  No changes in weight.  No dysuria.  No polyuria/polydipsia during the day.     ROS as above per HPI, otherwise neg.    The following portions of the patient's history were reviewed and updated as appropriate: allergies, current medications, past medical history, family and social history, and problem list. Patient is a nonsmoker.    PMH reviewed.  Past Medical History  Diagnosis Date  . Hypertension   . Anemia 2016  . Headache     history of migraines  . Edentulous   . Shortness of breath dyspnea     smoker and anemic   Past Surgical History  Procedure Laterality Date  . Tubal ligation    . Vaginal hysterectomy N/A 02/13/2015    Procedure: HYSTERECTOMY VAGINAL;  Surgeon: Donnamae Jude, MD;  Location: [facility];  Service: Gynecology;  Laterality: N/A;  . Bilateral salpingectomy N/A 02/13/2015    Procedure: BILATERAL SALPINGECTOMY;  Surgeon: Donnamae Jude, MD;  Location: [facility];  Service: Gynecology;  Laterality: N/A;    Medications reviewed. Current Outpatient Prescriptions  Medication Sig Dispense Refill  . ibuprofen (ADVIL,MOTRIN) 200 MG tablet Take 800 mg by mouth every 6 (six) hours as needed for mild pain.    Marland Kitchen lisinopril-hydrochlorothiazide (PRINZIDE,ZESTORETIC) 20-25 MG tablet TAKE 1 TABLET BY MOUTH DAILY. 30 tablet 3  . Ferrous Fumarate-Folic Acid 99991111 MG TABS Take 1 tablet by mouth 2 (two) times daily. (Patient not taking: Reported on 02/28/2015) 60 each 3  . hydrochlorothiazide (HYDRODIURIL) 25 MG tablet Take 1 tablet (25 mg total) by mouth daily. (Patient not taking:  Reported on 02/19/2016) 30 tablet 3   No current facility-administered medications for this visit.     Objective:   Physical Exam BP 123/88 mmHg  Pulse 82  Temp(Src) 98.2 F (36.8 C) (Oral)  Ht 5\' 10"  (1.778 m)  Wt 191 lb (86.637 kg)  BMI 27.41 kg/m2  LMP  (LMP Unknown) Gen:  Alert, cooperative patient who appears stated age in no acute distress.  Vital signs reviewed. HEENT: EOMI,  MMM Cardiac:  Regular rate and rhythm without murmur auscultated.  Good S1/S2. Pulm:  Clear to auscultation bilaterally with good air movement.  No wheezes or rales noted.   Abd:  Soft/nondistended/nontender.  Ext:  No LE edema  No results found for this or any previous visit (from the past 72 hour(s)).

## 2016-02-19 NOTE — Patient Instructions (Addendum)
Let's check some of the blood work today to see about your iron levels.    I'll call with the results.  It was good to meet you today.

## 2016-02-20 LAB — COMPREHENSIVE METABOLIC PANEL
ALBUMIN: 4.1 g/dL (ref 3.6–5.1)
ALT: 10 U/L (ref 6–29)
AST: 14 U/L (ref 10–30)
Alkaline Phosphatase: 52 U/L (ref 33–115)
BILIRUBIN TOTAL: 0.4 mg/dL (ref 0.2–1.2)
BUN: 9 mg/dL (ref 7–25)
CO2: 23 mmol/L (ref 20–31)
Calcium: 9.3 mg/dL (ref 8.6–10.2)
Chloride: 101 mmol/L (ref 98–110)
Creat: 0.65 mg/dL (ref 0.50–1.10)
Glucose, Bld: 76 mg/dL (ref 65–99)
Potassium: 4 mmol/L (ref 3.5–5.3)
SODIUM: 137 mmol/L (ref 135–146)
TOTAL PROTEIN: 6.6 g/dL (ref 6.1–8.1)

## 2016-02-21 NOTE — Assessment & Plan Note (Signed)
History of anemia in past.   Fatigue sounds situational -- up multiple times through the night plus early awakening for work and working 10 hours a day.   Checking BMET, TSH, CBC today.   Will call with results.

## 2016-02-27 ENCOUNTER — Telehealth: Payer: Self-pay | Admitting: Family Medicine

## 2016-02-27 NOTE — Telephone Encounter (Signed)
Called and left vague message that all labs were normal but that she should call back.  Will discuss other reasons for her fatigue at that time.

## 2016-04-26 ENCOUNTER — Other Ambulatory Visit: Payer: Self-pay | Admitting: Family Medicine

## 2016-05-20 IMAGING — US US TRANSVAGINAL NON-OB
1 series · 14 of 25 positions shown · non-contrast
Comparison: 12/21/2014

CLINICAL DATA: Menorrhagia.  LMP 12/05/2014.

EXAM:
ULTRASOUND PELVIS TRANSVAGINAL
TECHNIQUE: Transvaginal ultrasound examination of the pelvis was performed
including evaluation of the uterus, ovaries, adnexal regions, and
pelvic cul-de-sac.

[Series 1: us transvaginal non-ob · 50 acquisitions, 14 frames shown]
[im 1/50]
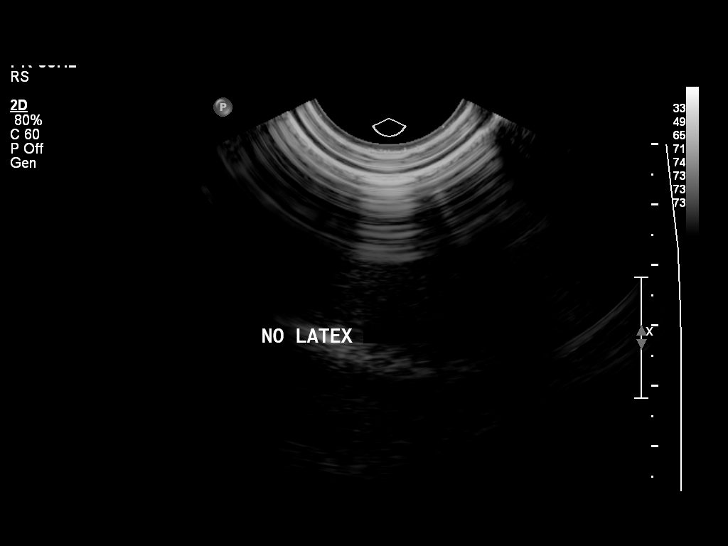
[im 5/50]
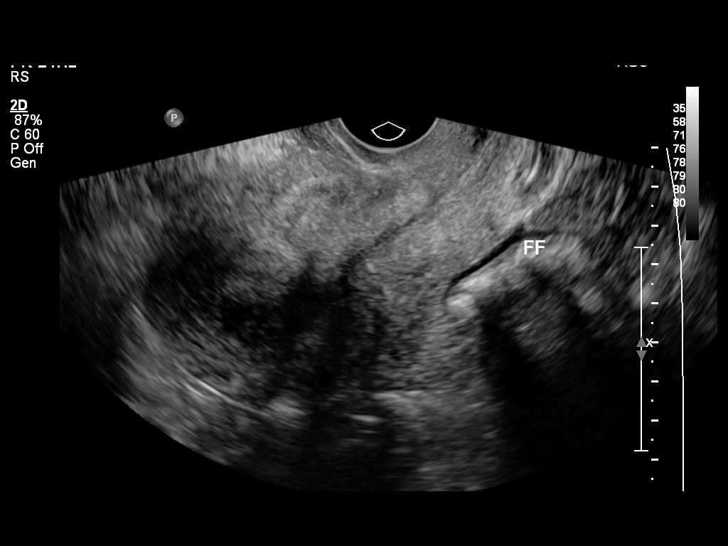
[im 9/50]
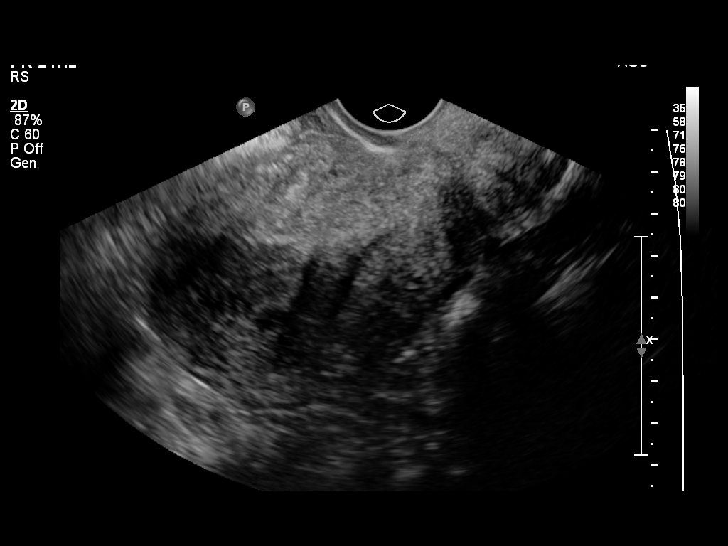
[im 13/50]
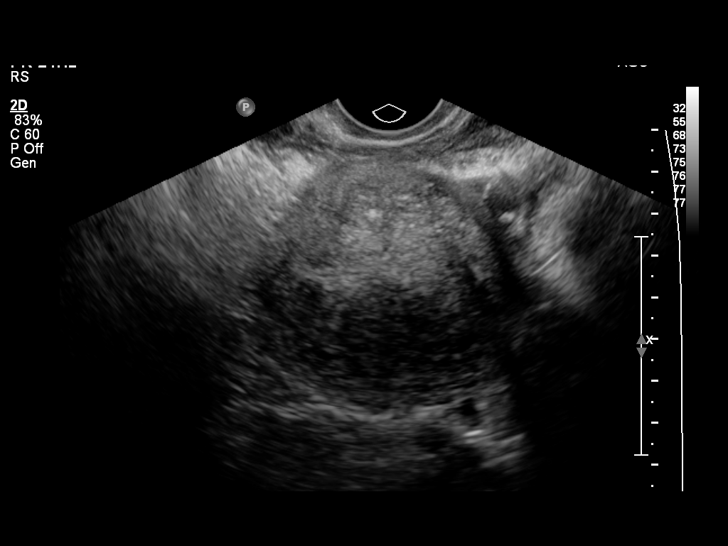
[im 17/50]
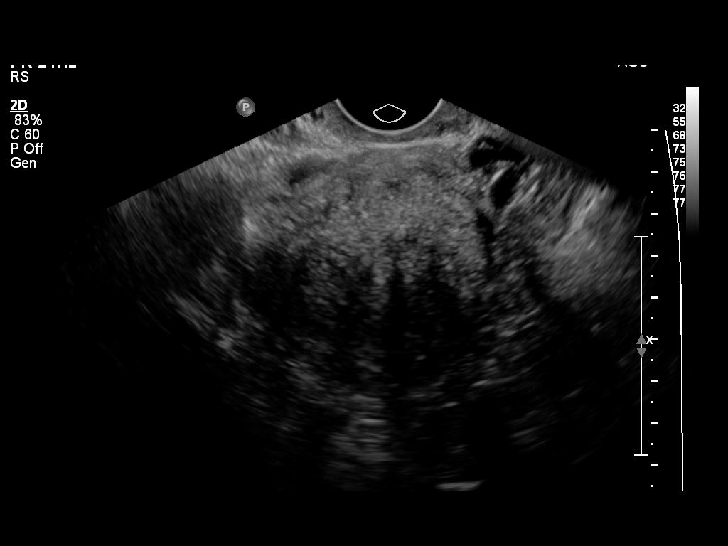
[im 19/50]
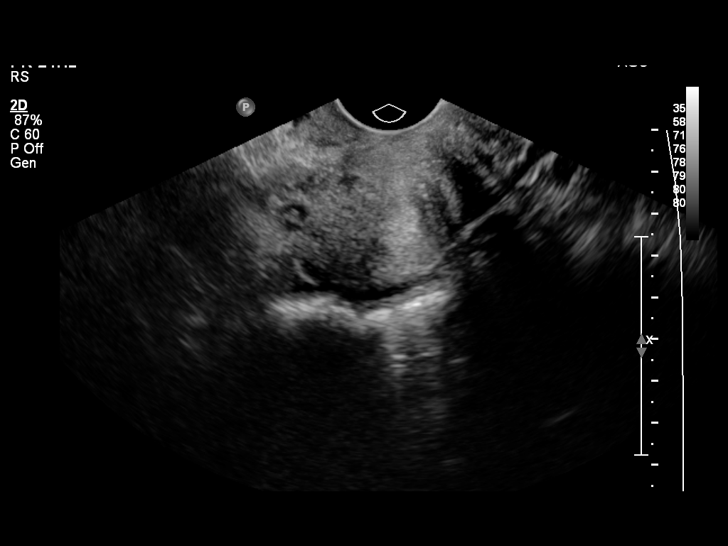
[im 23/50]
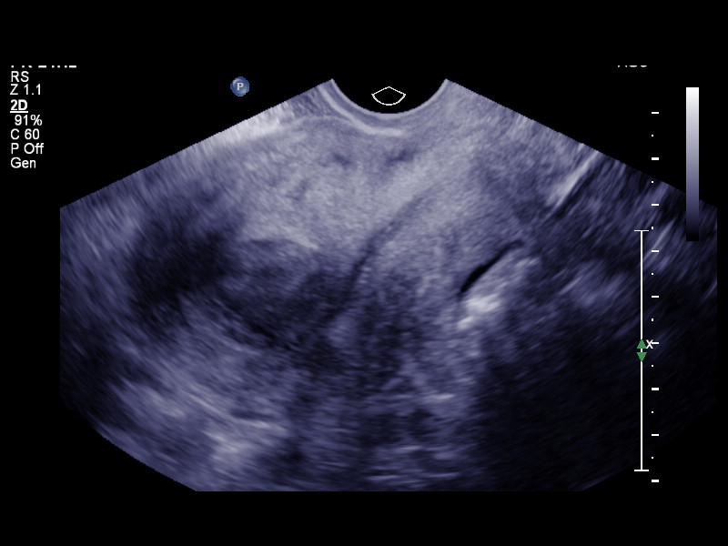
[im 27/50]
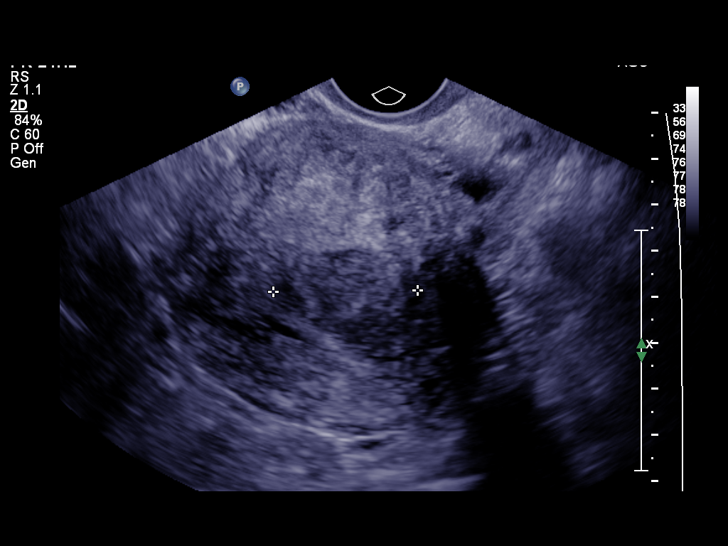
[im 31/50]
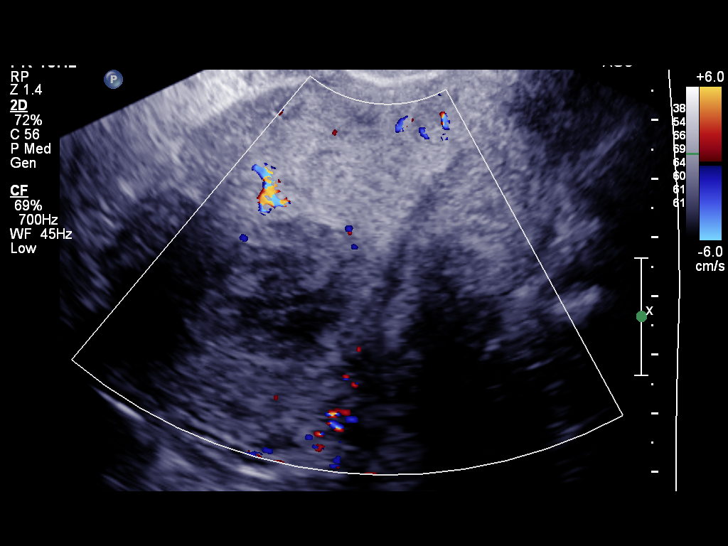
[im 33/50]
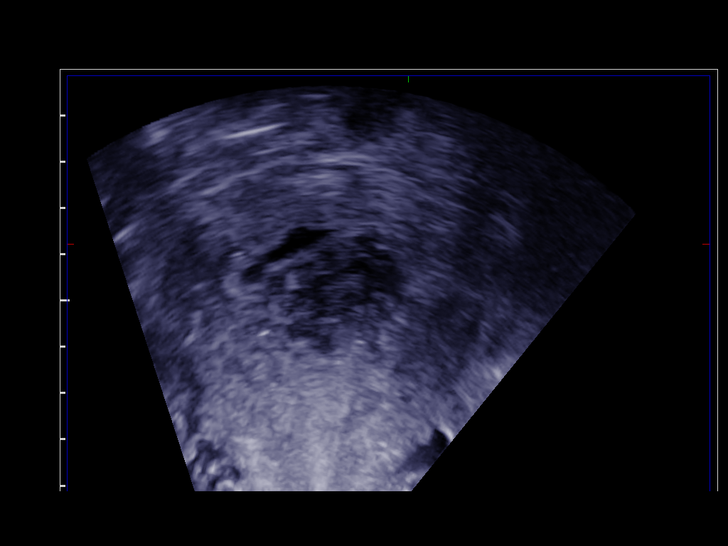
[im 37/50]
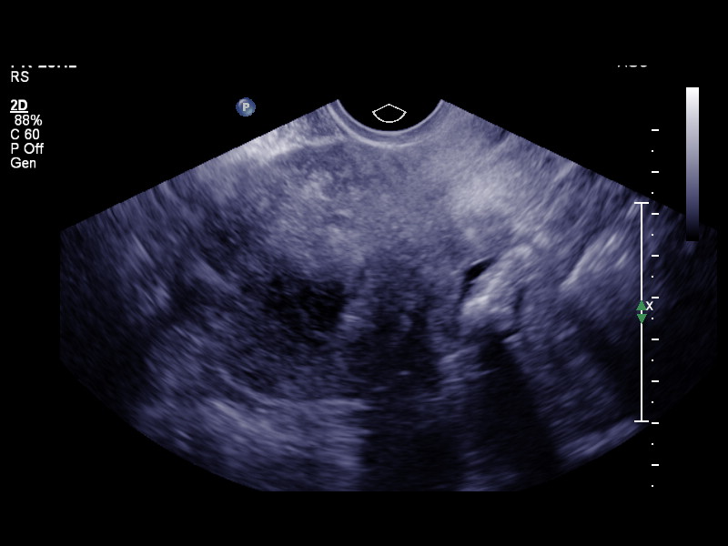
[im 41/50]
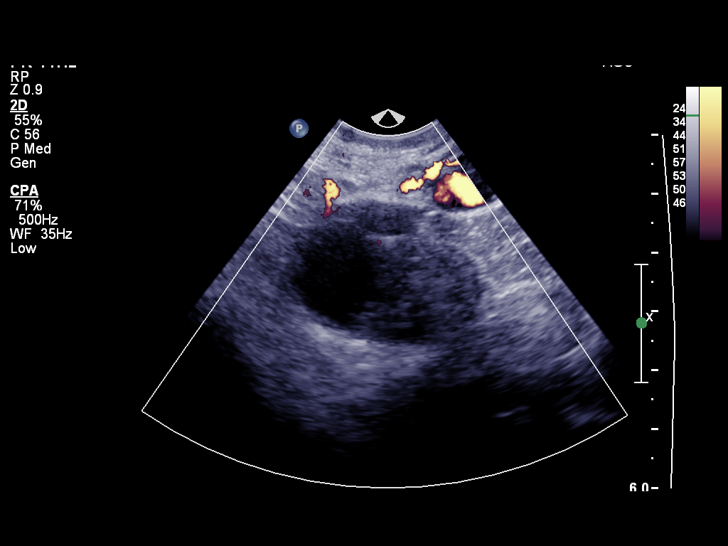
[im 45/50]
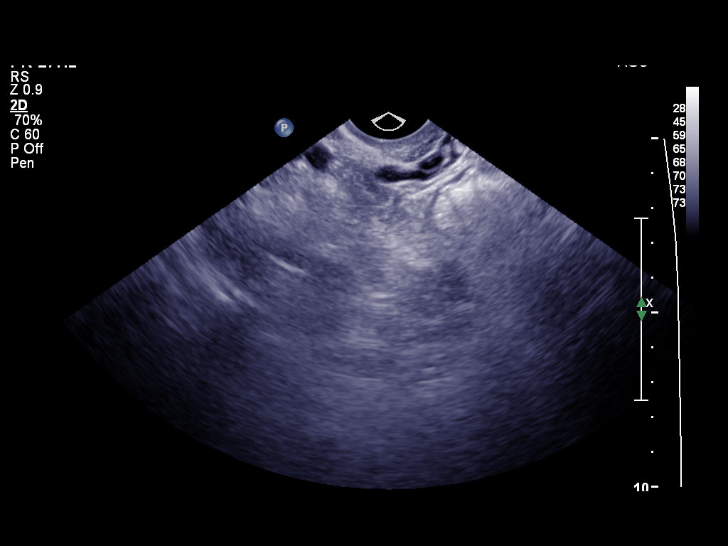
[im 50/50]
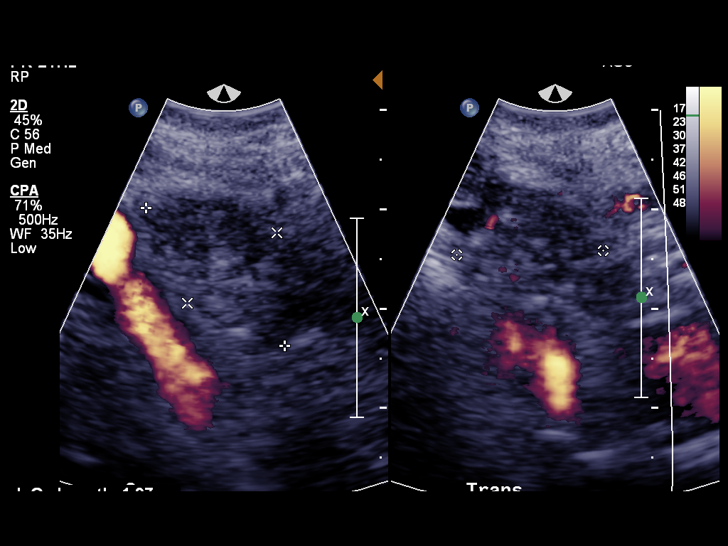

[14 of 25 positions shown; findings below may reference images not displayed]

FINDINGS: Uterus

Measurements: 11.0 x 7.1 x 6.2 cm. A submucosal fibroid is seen in
the anterior corpus which indents the endometrial cavity. This
measures 3.4 x 2.5 x 3.1 cm. Heterogeneous uterine myometrium noted,
but no other distinct fibroids visualized.

Endometrium

Thickness: Limited visualization due to submucosal fibroid described
above. Small amount of fluid noted in endometrial cavity.
Endometrial thickness measures approximately 7 mm transvaginally.

Right ovary

Measurements: 3.9 x 2.1 x 2.8 cm. Normal appearance/no adnexal mass.

Left ovary

Measurements: 2.0 x 1.2 x 1.5 cm. Normal appearance/no adnexal mass.

Other findings:  No free fluid
IMPRESSION: 3.4 cm submucosal fibroid in the anterior corpus which indents the
endometrial cavity.

Normal appearance of both ovaries.  No adnexal mass identified.

## 2016-05-27 ENCOUNTER — Encounter (HOSPITAL_COMMUNITY): Payer: Self-pay | Admitting: Emergency Medicine

## 2016-05-27 ENCOUNTER — Ambulatory Visit (HOSPITAL_COMMUNITY)
Admission: EM | Admit: 2016-05-27 | Discharge: 2016-05-27 | Disposition: A | Discharge status: Home / Self Care | Payer: BLUE CROSS/BLUE SHIELD | Attending: Family Medicine | Admitting: Family Medicine

## 2016-05-27 DIAGNOSIS — J069 Acute upper respiratory infection, unspecified: Secondary | ICD-10-CM | POA: Diagnosis not present

## 2016-05-27 MED ORDER — ALBUTEROL SULFATE HFA 108 (90 BASE) MCG/ACT IN AERS: 1.0000 | INHALATION_SPRAY | Freq: Four times a day (QID) | RESPIRATORY_TRACT | Status: DC | PRN

## 2016-05-27 MED ORDER — AMOXICILLIN 500 MG PO CAPS: 500.0000 mg | ORAL_CAPSULE | Freq: Three times a day (TID) | ORAL | Status: DC

## 2016-05-27 MED ORDER — HYDROCODONE-ACETAMINOPHEN 7.5-325 MG/15ML PO SOLN: 15.0000 mL | Freq: Four times a day (QID) | ORAL | Status: AC | PRN

## 2016-05-27 NOTE — Discharge Instructions (Signed)
Upper Respiratory Infection, Adult Most upper respiratory infections (URIs) are a viral infection of the air passages leading to the lungs. A URI affects the nose, throat, and upper air passages. The most common type of URI is nasopharyngitis and is typically referred to as "the common cold." URIs run their course and usually go away on their own. Most of the time, a URI does not require medical attention, but sometimes a bacterial infection in the upper airways can follow a viral infection. This is called a secondary infection. Sinus and middle ear infections are common types of secondary upper respiratory infections. Bacterial pneumonia can also complicate a URI. A URI can worsen asthma and chronic obstructive pulmonary disease (COPD). Sometimes, these complications can require emergency medical care and may be life threatening.  CAUSES Almost all URIs are caused by viruses. A virus is a type of germ and can spread from one person to another.  RISKS FACTORS You may be at risk for a URI if:   You smoke.   You have chronic heart or lung disease.  You have a weakened defense (immune) system.   You are very young or very old.   You have nasal allergies or asthma.  You work in crowded or poorly ventilated areas.  You work in health care facilities or schools. SIGNS AND SYMPTOMS  Symptoms typically develop 2-3 days after you come in contact with a cold virus. Most viral URIs last 7-10 days. However, viral URIs from the influenza virus (flu virus) can last 14-18 days and are typically more severe. Symptoms may include:   Runny or stuffy (congested) nose.   Sneezing.   Cough.   Sore throat.   Headache.   Fatigue.   Fever.   Loss of appetite.   Pain in your forehead, behind your eyes, and over your cheekbones (sinus pain).  Muscle aches.  DIAGNOSIS  Your health care provider may diagnose a URI by:  Physical exam.  Tests to check that your symptoms are not due to  another condition such as:  Strep throat.  Sinusitis.  Pneumonia.  Asthma. TREATMENT  A URI goes away on its own with time. It cannot be cured with medicines, but medicines may be prescribed or recommended to relieve symptoms. Medicines may help:  Reduce your fever.  Reduce your cough.  Relieve nasal congestion. HOME CARE INSTRUCTIONS   Take medicines only as directed by your health care provider.   Gargle warm saltwater or take cough drops to comfort your throat as directed by your health care provider.  Use a warm mist humidifier or inhale steam from a shower to increase air moisture. This may make it easier to breathe.  Drink enough fluid to keep your urine clear or pale yellow.   Eat soups and other clear broths and maintain good nutrition.   Rest as needed.   Return to work when your temperature has returned to normal or as your health care provider advises. You may need to stay home longer to avoid infecting others. You can also use a face mask and careful hand washing to prevent spread of the virus.  Increase the usage of your inhaler if you have asthma.   Do not use any tobacco products, including cigarettes, chewing tobacco, or electronic cigarettes. If you need help quitting, ask your health care provider. PREVENTION  The best way to protect yourself from getting a cold is to practice good hygiene.   Avoid oral or hand contact with people with cold  symptoms.   Wash your hands often if contact occurs.  There is no clear evidence that vitamin C, vitamin E, echinacea, or exercise reduces the chance of developing a cold. However, it is always recommended to get plenty of rest, exercise, and practice good nutrition.  SEEK MEDICAL CARE IF:   You are getting worse rather than better.   Your symptoms are not controlled by medicine.   You have chills.  You have worsening shortness of breath.  You have brown or red mucus.  You have yellow or brown nasal  discharge.  You have pain in your face, especially when you bend forward.  You have a fever.  You have swollen neck glands.  You have pain while swallowing.  You have white areas in the back of your throat. SEEK IMMEDIATE MEDICAL CARE IF:   You have severe or persistent:  Headache.  Ear pain.  Sinus pain.  Chest pain.  You have chronic lung disease and any of the following:  Wheezing.  Prolonged cough.  Coughing up blood.  A change in your usual mucus.  You have a stiff neck.  You have changes in your:  Vision.  Hearing.  Thinking.  Mood. MAKE SURE YOU:   Understand these instructions.  Will watch your condition.  Will get help right away if you are not doing well or get worse.   This information is not intended to replace advice given to you by your health care provider. Make sure you discuss any questions you have with your health care provider.   Document Released: 05/27/2001 Document Revised: 04/17/2015 Document Reviewed: 03/08/2014 Elsevier Interactive Patient Education 2016 Elsevier Inc.  Cough, Adult A cough helps to clear your throat and lungs. A cough may last only 2-3 weeks (acute), or it may last longer than 8 weeks (chronic). Many different things can cause a cough. A cough may be a sign of an illness or another medical condition. HOME CARE  Pay attention to any changes in your cough.  Take medicines only as told by your doctor.  If you were prescribed an antibiotic medicine, take it as told by your doctor. Do not stop taking it even if you start to feel better.  Talk with your doctor before you try using a cough medicine.  Drink enough fluid to keep your pee (urine) clear or pale yellow.  If the air is dry, use a cold steam vaporizer or humidifier in your home.  Stay away from things that make you cough at work or at home.  If your cough is worse at night, try using extra pillows to raise your head up higher while you  sleep.  Do not smoke, and try not to be around smoke. If you need help quitting, ask your doctor.  Do not have caffeine.  Do not drink alcohol.  Rest as needed. GET HELP IF:  You have new problems (symptoms).  You cough up yellow fluid (pus).  Your cough does not get better after 2-3 weeks, or your cough gets worse.  Medicine does not help your cough and you are not sleeping well.  You have pain that gets worse or pain that is not helped with medicine.  You have a fever.  You are losing weight and you do not know why.  You have night sweats. GET HELP RIGHT AWAY IF:  You cough up blood.  You have trouble breathing.  Your heartbeat is very fast.   This information is not intended to replace  advice given to you by your health care provider. Make sure you discuss any questions you have with your health care provider. °  °Document Released: 08/14/2011 Document Revised: 08/22/2015 Document Reviewed: 02/07/2015 °Elsevier Interactive Patient Education ©2016 Elsevier Inc. ° °

## 2016-05-27 NOTE — ED Provider Notes (Signed)
CSN: [id number]     Arrival date & time 05/27/16  1554 History   First MD Initiated Contact with Patient 05/27/16 1742     Chief Complaint  Patient presents with  . Cough   (Consider location/radiation/quality/duration/timing/severity/associated sxs/prior Treatment) HPI History obtained from patient: Location: Upper resp  Context/Duration: Sudden onset about one week ago, started with sneezing  Severity:3   Quality:ache, tightness Timing:         Constant but seems worse at night   Home Treatment: using son's albuterol, and taking cough medication Associated symptoms:  Feels hot Family History: bronchitis    Past Medical History  Diagnosis Date  . Hypertension   . Anemia 2016  . Headache     history of migraines  . Edentulous   . Shortness of breath dyspnea     smoker and anemic   Past Surgical History  Procedure Laterality Date  . Tubal ligation    . Vaginal hysterectomy N/A 02/13/2015    Procedure: HYSTERECTOMY VAGINAL;  Surgeon: Donnamae Jude, MD;  Location: [facility];  Service: Gynecology;  Laterality: N/A;  . Bilateral salpingectomy N/A 02/13/2015    Procedure: BILATERAL SALPINGECTOMY;  Surgeon: Donnamae Jude, MD;  Location: [facility];  Service: Gynecology;  Laterality: N/A;   No family history on file. Social History  Substance Use Topics  . Smoking status: Current Every Day Smoker -- 0.30 packs/day for 30 years    Types: Cigarettes  . Smokeless tobacco: Never Used  . Alcohol Use: No   OB History    Gravida Para Term Preterm AB TAB SAB Ectopic Multiple Living   3 3        3      Review of Systems  Denies: HEADACHE, NAUSEA, ABDOMINAL PAIN, CHEST PAIN, CONGESTION, DYSURIA, SHORTNESS OF BREATH  Allergies  Review of patient's allergies indicates no known allergies.  Home Medications   Prior to Admission medications   Medication Sig Start Date End Date Taking? Authorizing Provider  albuterol (PROVENTIL HFA;VENTOLIN HFA) 108 (90 Base) MCG/ACT inhaler Inhale 1-2 puffs  into the lungs every 6 (six) hours as needed for wheezing or shortness of breath. 05/27/16   Konrad Felix, PA  amoxicillin (AMOXIL) 500 MG capsule Take 1 capsule (500 mg total) by mouth 3 (three) times daily. 05/27/16   Konrad Felix, PA  Ferrous Fumarate-Folic Acid 99991111 MG TABS Take 1 tablet by mouth 2 (two) times daily. Patient not taking: Reported on 02/28/2015 01/18/15   Donnamae Jude, MD  hydrochlorothiazide (HYDRODIURIL) 25 MG tablet Take 1 tablet (25 mg total) by mouth daily. Patient not taking: Reported on 02/19/2016 02/28/15   Donnamae Jude, MD  HYDROcodone-acetaminophen (HYCET) 7.5-325 mg/15 ml solution Take 15 mLs by mouth 4 (four) times daily as needed for moderate pain. 05/27/16 05/27/17  Konrad Felix, PA  ibuprofen (ADVIL,MOTRIN) 200 MG tablet Take 800 mg by mouth every 6 (six) hours as needed for mild pain.    Historical Provider, MD  lisinopril-hydrochlorothiazide (PRINZIDE,ZESTORETIC) 20-25 MG tablet TAKE 1 TAB BY MOUTH EVERY DAY 04/28/16   Donnamae Jude, MD   Meds Ordered and Administered this Visit  Medications - No data to display  BP 138/100 mmHg  Pulse 84  Temp(Src) 98.2 F (36.8 C) (Oral)  Resp 20  Wt 181 lb (82.101 kg)  SpO2 100%  LMP  (LMP Unknown) No data found.   Physical Exam NURSES NOTES AND VITAL SIGNS REVIEWED. CONSTITUTIONAL: Well developed, well nourished, no acute distress HEENT:  normocephalic, atraumatic EYES: Conjunctiva normal NECK:normal ROM, supple, no adenopathy PULMONARY:No respiratory distress, normal effort, few diffuse wheezes noted, speaks in full sentences.  ABDOMINAL: Soft, ND, NT BS+, No CVAT MUSCULOSKELETAL: Normal ROM of all extremities,  SKIN: warm and dry without rash PSYCHIATRIC: Mood and affect, behavior are normal  ED Course  Procedures (including critical care time)  Labs Review Labs Reviewed - No data to display  Imaging Review No results found.   Visual Acuity Review  Right Eye Distance:   Left Eye Distance:    Bilateral Distance:    Right Eye Near:   Left Eye Near:    Bilateral Near:       Expect full recovery. Advised that cough may linger up to 6 weeks post treatment.   rx albuterol, amoxil, hydrocet.   MDM   1. Acute URI     Patient is reassured that there are no issues that require transfer to higher level of care at this time or additional tests. Patient is advised to continue home symptomatic treatment. Patient is advised that if there are new or worsening symptoms to attend the emergency department, contact primary care provider, or return to UC. Instructions of care provided discharged home in stable condition.    THIS NOTE WAS GENERATED USING A VOICE RECOGNITION SOFTWARE PROGRAM. ALL REASONABLE EFFORTS  WERE MADE TO PROOFREAD THIS DOCUMENT FOR ACCURACY.  I have verbally reviewed the discharge instructions with the patient. A printed AVS was given to the patient.  All questions were answered prior to discharge.      Konrad Felix, [place] 05/27/16 1807

## 2016-05-27 NOTE — ED Notes (Signed)
Patient complains of cough and difficulty breathing, and headache.

## 2016-09-10 ENCOUNTER — Other Ambulatory Visit: Payer: Self-pay | Admitting: Family Medicine

## 2016-10-20 ENCOUNTER — Other Ambulatory Visit: Payer: Self-pay | Admitting: *Deleted

## 2016-10-20 MED ORDER — LISINOPRIL-HYDROCHLOROTHIAZIDE 20-25 MG PO TABS: ORAL_TABLET | ORAL | 3 refills | Status: DC

## 2017-03-28 ENCOUNTER — Other Ambulatory Visit: Payer: Self-pay | Admitting: Family Medicine

## 2017-08-14 ENCOUNTER — Other Ambulatory Visit: Payer: Self-pay | Admitting: Family Medicine

## 2017-09-17 ENCOUNTER — Encounter (HOSPITAL_COMMUNITY): Payer: Self-pay | Admitting: Emergency Medicine

## 2017-09-17 ENCOUNTER — Ambulatory Visit (HOSPITAL_COMMUNITY)
Admission: EM | Admit: 2017-09-17 | Discharge: 2017-09-17 | Disposition: A | Discharge status: Home / Self Care | Payer: BLUE CROSS/BLUE SHIELD | Attending: Family Medicine | Admitting: Family Medicine

## 2017-09-17 DIAGNOSIS — R1032 Left lower quadrant pain: Secondary | ICD-10-CM | POA: Diagnosis not present

## 2017-09-17 DIAGNOSIS — R110 Nausea: Secondary | ICD-10-CM | POA: Diagnosis not present

## 2017-09-17 MED ORDER — ONDANSETRON HCL 4 MG PO TABS: 4.0000 mg | ORAL_TABLET | Freq: Three times a day (TID) | ORAL | 0 refills | Status: DC | PRN

## 2017-09-17 NOTE — ED Triage Notes (Signed)
Pt sts LLQ abd and nausea x 3 days

## 2017-09-17 NOTE — ED Provider Notes (Signed)
[facility]    CSN: [id number] Arrival date & time: 09/17/17  1628     History   Chief Complaint Chief Complaint  Patient presents with  . Abdominal Pain    HPI Krista Foster is a 46 y.o. female.     46 yo presents with left lower abdominal pain. It began 3 days ago and has now started to  improve but wanted to get evaluated. She described pain that is sharp in this area and for the past few days has awoken her from sleep. She has ongoing nausea. She has loose watery stools without tarry or red stools. She has no fever or chills. No urinary or vaginal symptoms.       Past Medical History:  Diagnosis Date  . Anemia 2016  . Edentulous   . Headache    history of migraines  . Hypertension   . Shortness of breath dyspnea    smoker and anemic    Patient Active Problem List   Diagnosis Date Noted  . Acute blood loss anemia 12/21/2014  . Fatigue 09/05/2011  . DYSPNEA 10/25/2010  . TOBACCO USER 09/22/2009  . OVERWEIGHT 08/01/2008  . MENORRHAGIA 08/01/2008  . DEPRESSIVE DISORDER, NOS 02/11/2007  . MIGRAINE, UNSPEC., W/O INTRACTABLE MIGRAINE 02/11/2007  . HYPERTENSION, BENIGN SYSTEMIC 02/11/2007    Past Surgical History:  Procedure Laterality Date  . BILATERAL SALPINGECTOMY N/A 02/13/2015   Procedure: BILATERAL SALPINGECTOMY;  Surgeon: Donnamae Jude, MD;  Location: [facility];  Service: Gynecology;  Laterality: N/A;  . TUBAL LIGATION    . VAGINAL HYSTERECTOMY N/A 02/13/2015   Procedure: HYSTERECTOMY VAGINAL;  Surgeon: Donnamae Jude, MD;  Location: [facility];  Service: Gynecology;  Laterality: N/A;    OB History    Gravida Para Term Preterm AB Living   3 3       3    SAB TAB Ectopic Multiple Live Births                   Home Medications    Prior to Admission medications   Medication Sig Start Date End Date Taking? Authorizing Provider  albuterol (PROVENTIL HFA;VENTOLIN HFA) 108 (90 Base) MCG/ACT inhaler Inhale 1-2 puffs into the lungs every 6 (six) hours as  needed for wheezing or shortness of breath. 05/27/16   Konrad Felix, PA  amoxicillin (AMOXIL) 500 MG capsule Take 1 capsule (500 mg total) by mouth 3 (three) times daily. 05/27/16   Konrad Felix, PA  Ferrous Fumarate-Folic Acid 097-3 MG TABS Take 1 tablet by mouth 2 (two) times daily. Patient not taking: Reported on 02/28/2015 01/18/15   Donnamae Jude, MD  hydrochlorothiazide (HYDRODIURIL) 25 MG tablet Take 1 tablet (25 mg total) by mouth daily. Patient not taking: Reported on 02/19/2016 02/28/15   Donnamae Jude, MD  ibuprofen (ADVIL,MOTRIN) 200 MG tablet Take 800 mg by mouth every 6 (six) hours as needed for mild pain.    [provider]  lisinopril-hydrochlorothiazide (PRINZIDE,ZESTORETIC) 20-25 MG tablet TAKE 1 TAB BY MOUTH EVERY DAY 08/14/17   Donnamae Jude, MD  ondansetron (ZOFRAN) 4 MG tablet Take 1 tablet (4 mg total) by mouth every 8 (eight) hours as needed for nausea or vomiting. 09/17/17   Bjorn Pippin, PA-C    Family History History reviewed. No pertinent family history.  Social History Social History  Substance Use Topics  . Smoking status: Current Every Day Smoker    Packs/day: 0.30    Years: 30.00  Types: Cigarettes  . Smokeless tobacco: Never Used  . Alcohol use No     Allergies   Patient has no known allergies.   Review of Systems Review of Systems  Constitutional: Negative for chills and fever.  Gastrointestinal: Positive for abdominal pain and nausea. Negative for abdominal distention, anal bleeding, blood in stool, constipation, diarrhea and vomiting.  Endocrine: Negative for polyuria.  Genitourinary: Negative for dysuria, flank pain, menstrual problem, pelvic pain and urgency.  Musculoskeletal: Negative for back pain.  Skin: Negative.      Physical Exam Triage Vital Signs ED Triage Vitals [09/17/17 1700]  Enc Vitals Group     BP 137/85     Pulse Rate 72     Resp 18     Temp 98.5 F (36.9 C)     Temp Source Oral     SpO2 97 %      Weight      Height      Head Circumference      Peak Flow      Pain Score 0     Pain Loc      Pain Edu?      Excl. in [place]?    No data found.   Updated Vital Signs BP 137/85 (BP Location: Left Arm)   Pulse 72   Temp 98.5 F (36.9 C) (Oral)   Resp 18   LMP  (LMP Unknown)   SpO2 97%   Visual Acuity Right Eye Distance:   Left Eye Distance:   Bilateral Distance:    Right Eye Near:   Left Eye Near:    Bilateral Near:     Physical Exam  Constitutional: She is oriented to person, place, and time. She appears well-developed and well-nourished. No distress.  Sitting comfortable on exam table  Abdominal: Soft. Bowel sounds are normal. She exhibits no distension and no mass. There is tenderness. There is no rebound and no guarding.  Tenderness to palpation to LLQ  Neurological: She is alert and oriented to person, place, and time.  Skin: Skin is warm and dry. She is not diaphoretic.  Psychiatric: Her behavior is normal.  Nursing note and vitals reviewed.    UC Treatments / Results  Labs (all labs ordered are listed, but only abnormal results are displayed) Labs Reviewed - No data to display  EKG  EKG Interpretation None       Radiology No results found.  Procedures Procedures (including critical care time)  Medications Ordered in UC Medications - No data to display   Initial Impression / Assessment and Plan / UC Course  I have reviewed the triage vital signs and the nursing notes.  Pertinent labs & imaging results that were available during my care of the patient were reviewed by me and considered in my medical decision making (see chart for details).     Abdominal pain that is improving. She is non-toxic with a non-acute abdominal exam. Will treat symptomatically at this time with Zofran and a bland diet. FU with PCP if not improving or in the ED if worsens.   Final Clinical Impressions(s) / UC Diagnoses   Final diagnoses:  Left lower quadrant pain    Nausea    New Prescriptions New Prescriptions   ONDANSETRON (ZOFRAN) 4 MG TABLET    Take 1 tablet (4 mg total) by mouth every 8 (eight) hours as needed for nausea or vomiting.     Controlled Substance Prescriptions [place] Controlled Substance Registry consulted? Not Applicable  Bjorn Pippin, PA-C 09/17/17 1743

## 2017-09-17 NOTE — Discharge Instructions (Signed)
This is most likely a viral illness since this is improving. May take Zofran every 8 hours as needed for nausea. Also consider a fluid and bland diet to avoid high fat foods. If pain lingers check in with your PCP next week, certainly if you worsen then go to the Ed for more imaging. Feel better.

## 2017-10-09 ENCOUNTER — Ambulatory Visit (HOSPITAL_COMMUNITY)
Admission: EM | Admit: 2017-10-09 | Discharge: 2017-10-09 | Disposition: A | Discharge status: Home / Self Care | Payer: BLUE CROSS/BLUE SHIELD | Attending: Emergency Medicine | Admitting: Emergency Medicine

## 2017-10-09 ENCOUNTER — Encounter (HOSPITAL_COMMUNITY): Payer: Self-pay | Admitting: Emergency Medicine

## 2017-10-09 DIAGNOSIS — J00 Acute nasopharyngitis [common cold]: Secondary | ICD-10-CM | POA: Diagnosis not present

## 2017-10-09 MED ORDER — CETIRIZINE-PSEUDOEPHEDRINE ER 5-120 MG PO TB12: 1.0000 | ORAL_TABLET | Freq: Every day | ORAL | 0 refills | Status: AC

## 2017-10-09 MED ORDER — BENZONATATE 100 MG PO CAPS: 100.0000 mg | ORAL_CAPSULE | Freq: Three times a day (TID) | ORAL | 0 refills | Status: DC

## 2017-10-09 MED ORDER — FLUTICASONE PROPIONATE 50 MCG/ACT NA SUSP: 2.0000 | Freq: Every day | NASAL | 0 refills | Status: DC

## 2017-10-09 NOTE — Discharge Instructions (Signed)
Tessalon for cough. Start flonase, zyrtec-D for nasal congestion. You can use over the counter nasal saline rinse such as neti pot for nasal congestion. Keep hydrated, your urine should be clear to pale yellow in color. Tylenol/motrin for fever and pain. Monitor for any worsening of symptoms, chest pain, shortness of breath, wheezing, swelling of the throat, follow up for reevaluation.

## 2017-10-09 NOTE — ED Provider Notes (Signed)
[name]    CSN: [id number] Arrival date & time: 10/09/17  1049     History   Chief Complaint Chief Complaint  Patient presents with  . URI    HPI Krista Foster is a 46 y.o. female.   46 year old female with history of anemia, HTN, depression, comes in for 4 day history of URI symptoms. She reports productive cough, subjective fevers, chills, body aches, headaches and sneezing. Also has had some nasal congestion, rhinorrhea.  She states symptoms happened after flu shot. She is taking otc meds with temporary relief. Denies chest pain. Feels short of breath stating hard to breath with nasal congestion. Denies wheezing. Denies sick contact. Denies history of seasonal allergies. Current every day smoker, 1ppd and has now decreased to half that due to her recent symptoms. About 30 pack year history.       Past Medical History:  Diagnosis Date  . Anemia 2016  . Edentulous   . Headache    history of migraines  . Hypertension   . Shortness of breath dyspnea    smoker and anemic    Patient Active Problem List   Diagnosis Date Noted  . Acute blood loss anemia 12/21/2014  . Fatigue 09/05/2011  . DYSPNEA 10/25/2010  . TOBACCO USER 09/22/2009  . OVERWEIGHT 08/01/2008  . MENORRHAGIA 08/01/2008  . DEPRESSIVE DISORDER, NOS 02/11/2007  . MIGRAINE, UNSPEC., W/O INTRACTABLE MIGRAINE 02/11/2007  . HYPERTENSION, BENIGN SYSTEMIC 02/11/2007    Past Surgical History:  Procedure Laterality Date  . BILATERAL SALPINGECTOMY N/A 02/13/2015   Procedure: BILATERAL SALPINGECTOMY;  Surgeon: Donnamae Jude, MD;  Location: [facility];  Service: Gynecology;  Laterality: N/A;  . TUBAL LIGATION    . VAGINAL HYSTERECTOMY N/A 02/13/2015   Procedure: HYSTERECTOMY VAGINAL;  Surgeon: Donnamae Jude, MD;  Location: [facility];  Service: Gynecology;  Laterality: N/A;    OB History    Gravida Para Term Preterm AB Living   3 3       3    SAB TAB Ectopic Multiple Live Births                   Home  Medications    Prior to Admission medications   Medication Sig Start Date End Date Taking? Authorizing Provider  lisinopril-hydrochlorothiazide (PRINZIDE,ZESTORETIC) 20-25 MG tablet TAKE 1 TAB BY MOUTH EVERY DAY 08/14/17  Yes Donnamae Jude, MD  benzonatate (TESSALON) 100 MG capsule Take 1 capsule (100 mg total) by mouth every 8 (eight) hours. 10/09/17   Tasia Catchings, Riggins Cisek V, PA-C  cetirizine-pseudoephedrine (ZYRTEC-D) 5-120 MG tablet Take 1 tablet by mouth daily. 10/09/17   Tasia Catchings, Debborah Alonge V, PA-C  fluticasone (FLONASE) 50 MCG/ACT nasal spray Place 2 sprays into both nostrils daily. 10/09/17   Ok Edwards, PA-C    Family History History reviewed. No pertinent family history.  Social History Social History  Substance Use Topics  . Smoking status: Current Every Day Smoker    Packs/day: 0.30    Years: 30.00    Types: Cigarettes  . Smokeless tobacco: Never Used  . Alcohol use No     Allergies   Patient has no known allergies.   Review of Systems Review of Systems  Reason unable to perform ROS: See HPI as above.     Physical Exam Triage Vital Signs ED Triage Vitals  Enc Vitals Group     BP 10/09/17 1112 106/67     Pulse Rate 10/09/17 1112 91  Resp 10/09/17 1112 (!) 22     Temp 10/09/17 1112 98.5 F (36.9 C)     Temp Source 10/09/17 1112 Oral     SpO2 10/09/17 1112 96 %     Weight --      Height --      Head Circumference --      Peak Flow --      Pain Score 10/09/17 1114 4     Pain Loc --      Pain Edu? --      Excl. in [place]? --    No data found.   Updated Vital Signs BP 106/67 (BP Location: Right Arm)   Pulse 91   Temp 98.5 F (36.9 C) (Oral)   Resp (!) 22   LMP  (LMP Unknown)   SpO2 96%   Physical Exam  Constitutional: She is oriented to person, place, and time. She appears well-developed and well-nourished. No distress.  HENT:  Head: Normocephalic and atraumatic.  Right Ear: External ear and ear canal normal. Tympanic membrane is erythematous. Tympanic membrane is not  bulging.  Left Ear: External ear and ear canal normal. Tympanic membrane is erythematous. Tympanic membrane is not bulging.  Nose: Mucosal edema and rhinorrhea present. Right sinus exhibits no maxillary sinus tenderness and no frontal sinus tenderness. Left sinus exhibits no maxillary sinus tenderness and no frontal sinus tenderness.  Mouth/Throat: Uvula is midline, oropharynx is clear and moist and mucous membranes are normal.  Eyes: Pupils are equal, round, and reactive to light. Conjunctivae are normal.  Neck: Normal range of motion. Neck supple.  Cardiovascular: Normal rate, regular rhythm and normal heart sounds.  Exam reveals no gallop and no friction rub.   No murmur heard. Pulmonary/Chest: Effort normal and breath sounds normal. She has no decreased breath sounds. She has no wheezes. She has no rhonchi. She has no rales.  Lymphadenopathy:    She has no cervical adenopathy.  Neurological: She is alert and oriented to person, place, and time.  Skin: Skin is warm and dry.  Psychiatric: She has a normal mood and affect. Her behavior is normal. Judgment normal.     UC Treatments / Results  Labs (all labs ordered are listed, but only abnormal results are displayed) Labs Reviewed - No data to display  EKG  EKG Interpretation None       Radiology No results found.  Procedures Procedures (including critical care time)  Medications Ordered in UC Medications - No data to display   Initial Impression / Assessment and Plan / UC Course  I have reviewed the triage vital signs and the nursing notes.  Pertinent labs & imaging results that were available during my care of the patient were reviewed by me and considered in my medical decision making (see chart for details).    Discussed with patient history and exam most consistent with viral URI. Discussed given smoking history, possible COPD exacerbation, though patient without formal diagnosis. States only needed inhaler once in  the past during illness. Given patient without cough on exam, lung exam clear to auscultation bilaterally without adventitious lung sounds, with improving symptoms, will defer COPD exacerbation treatment for now. Symptomatic treatment as needed. Push fluids. Return precautions given.    Final Clinical Impressions(s) / UC Diagnoses   Final diagnoses:  Acute nasopharyngitis    New Prescriptions New Prescriptions   BENZONATATE (TESSALON) 100 MG CAPSULE    Take 1 capsule (100 mg total) by mouth every 8 (eight) hours.  CETIRIZINE-PSEUDOEPHEDRINE (ZYRTEC-D) 5-120 MG TABLET    Take 1 tablet by mouth daily.   FLUTICASONE (FLONASE) 50 MCG/ACT NASAL SPRAY    Place 2 sprays into both nostrils daily.      Ok Edwards, PA-C 10/09/17 1157

## 2017-10-09 NOTE — ED Triage Notes (Signed)
Pt c/o cold sx onset: 4 days  Sx include: prod cough, fevers, chills, BAs, HAs, sneezing... sts sx happened after getting flu shot  Taking: OTC cold meds w/temp relief.   A&O x4... NAD

## 2018-01-12 ENCOUNTER — Other Ambulatory Visit: Payer: Self-pay | Admitting: *Deleted

## 2018-01-12 MED ORDER — LISINOPRIL-HYDROCHLOROTHIAZIDE 20-25 MG PO TABS: ORAL_TABLET | ORAL | 3 refills | Status: DC

## 2018-07-20 ENCOUNTER — Encounter: Payer: Self-pay | Admitting: Family Medicine

## 2018-07-20 ENCOUNTER — Ambulatory Visit (INDEPENDENT_AMBULATORY_CARE_PROVIDER_SITE_OTHER): Payer: 59 | Admitting: Family Medicine

## 2018-07-20 ENCOUNTER — Other Ambulatory Visit: Payer: Self-pay | Admitting: Family Medicine

## 2018-07-20 VITALS — BP 151/94 | HR 112 | Resp 20 | Ht 70.0 in | Wt 191.4 lb

## 2018-07-20 DIAGNOSIS — R232 Flushing: Secondary | ICD-10-CM | POA: Diagnosis not present

## 2018-07-20 DIAGNOSIS — I10 Essential (primary) hypertension: Secondary | ICD-10-CM

## 2018-07-20 DIAGNOSIS — L237 Allergic contact dermatitis due to plants, except food: Secondary | ICD-10-CM | POA: Diagnosis not present

## 2018-07-20 DIAGNOSIS — Z1231 Encounter for screening mammogram for malignant neoplasm of breast: Secondary | ICD-10-CM

## 2018-07-20 DIAGNOSIS — Z0000 Encounter for general adult medical examination without abnormal findings: Secondary | ICD-10-CM

## 2018-07-20 DIAGNOSIS — N898 Other specified noninflammatory disorders of vagina: Secondary | ICD-10-CM

## 2018-07-20 DIAGNOSIS — Z113 Encounter for screening for infections with a predominantly sexual mode of transmission: Secondary | ICD-10-CM | POA: Diagnosis not present

## 2018-07-20 MED ORDER — LISINOPRIL 2.5 MG PO TABS: 2.5000 mg | ORAL_TABLET | Freq: Every day | ORAL | 2 refills | Status: AC

## 2018-07-20 MED ORDER — PREDNISONE 10 MG (21) PO TBPK: ORAL_TABLET | ORAL | 0 refills | Status: DC

## 2018-07-20 NOTE — Progress Notes (Signed)
Subjective:    Patient ID: Krista Foster is a 47 y.o. female presenting with Hot Flashes; Vaginal Bleeding; and Vaginal Itching (Vagisil not working; )  on 07/20/2018  HPI: S/p TVH and bilateral salpingectomy in 2016. Reports vaginal spotting x 3 months. Real light pink when using the bathroom. Hot flashes and can't sleep, early morning awakening, night sweats. Vaginal itching and used monistat x 3. Used vagisil and that helped some, but that stopped working. Using honest diaper rash cream and it is still itching. Also having 1 wk of poison ivy, new lesions occurring, topical treatments only.   Review of Systems  Constitutional: Negative for chills and fever.  Respiratory: Negative for shortness of breath.   Cardiovascular: Negative for chest pain.  Gastrointestinal: Negative for abdominal pain, nausea and vomiting.  Genitourinary: Negative for dysuria.  Skin: Negative for rash.      Objective:    BP (!) 151/94 (BP Location: Left Arm, Patient Position: Sitting, Cuff Size: Large) Comment: Patient reports she's been out of BP meds x 3 mths/ins  Pulse (!) 112   Resp 20   Ht 5\' 10"  (1.778 m)   Wt 191 lb 6.4 oz (86.8 kg)   LMP 05/15/2018 (Approximate) Comment: Spotting  BMI 27.46 kg/m  Physical Exam  Constitutional: She is oriented to person, place, and time. She appears well-developed and well-nourished.  HENT:  Head: Normocephalic and atraumatic.  Eyes: Pupils are equal, round, and reactive to light. No scleral icterus.  Neck: Normal range of motion. No thyromegaly present.  Cardiovascular: Normal rate, regular rhythm and intact distal pulses.  Pulmonary/Chest: Effort normal and breath sounds normal.  Abdominal: Soft. She exhibits no distension. There is no tenderness.  Genitourinary:  Genitourinary Comments: Vagina is pink with rugae. There is not any loss of architecture on the labia. There is no bleeding noted and the vaginal cuff is well healed. Thick yellow discharge noted    Neurological: She is alert and oriented to person, place, and time.  Skin: Skin is warm and dry. Rash (linear, blisters, papules noted on majority of lower legs bilaterally) noted.        Assessment & Plan:   Problem List Items Addressed This Visit      Unprioritized   HYPERTENSION, BENIGN SYSTEMIC - Primary    Restart Lisinopril at low dose      Relevant Medications   lisinopril (ZESTRIL) 2.5 MG tablet   Vaginal itching    Unclear etiology--will check wet prep with STD check.      Relevant Orders   Cervicovaginal ancillary only   Hot flashes    Check TSH and FSH      Relevant Medications   lisinopril (ZESTRIL) 2.5 MG tablet   Other Relevant Orders   Follicle stimulating hormone   TSH    Other Visit Diagnoses    Routine medical exam       needs yearly labs--does not need pap due to hysterectomy   Relevant Orders   CBC   Comprehensive metabolic panel   Hemoglobin A1c   Vitamin D (25 hydroxy)   Lipid panel   MM DIGITAL SCREENING BILATERAL   Allergic contact dermatitis due to plants, except food       prednisone taper.   Relevant Medications   predniSONE (STERAPRED UNI-PAK 21 TAB) 10 MG (21) TBPK tablet       Total face-to-face time with patient: 20 minutes. Over 50% of encounter was spent on counseling and coordination of care. Return in  about 1 month (around 08/17/2018).  Donnamae Jude 07/20/2018 1:45 PM

## 2018-07-20 NOTE — Patient Instructions (Signed)

## 2018-07-21 ENCOUNTER — Encounter: Payer: Self-pay | Admitting: Family Medicine

## 2018-07-21 LAB — CERVICOVAGINAL ANCILLARY ONLY
BACTERIAL VAGINITIS: POSITIVE — AB
Candida vaginitis: NEGATIVE
Chlamydia: NEGATIVE
NEISSERIA GONORRHEA: NEGATIVE
TRICH (WINDOWPATH): POSITIVE — AB

## 2018-07-21 MED ORDER — METRONIDAZOLE 500 MG PO TABS: 500.0000 mg | ORAL_TABLET | Freq: Two times a day (BID) | ORAL | 0 refills | Status: DC

## 2018-07-21 NOTE — Assessment & Plan Note (Signed)
Check TSH and [facility]

## 2018-07-21 NOTE — Addendum Note (Signed)
Addended by: Donnamae Jude on: 07/21/2018 04:55 PM   Modules accepted: Orders

## 2018-07-21 NOTE — Assessment & Plan Note (Signed)
Unclear etiology--will check wet prep with STD check.

## 2018-07-21 NOTE — Assessment & Plan Note (Signed)
Restart Lisinopril at low dose

## 2018-08-09 ENCOUNTER — Ambulatory Visit (HOSPITAL_COMMUNITY)
Admission: EM | Admit: 2018-08-09 | Discharge: 2018-08-09 | Disposition: A | Discharge status: Home / Self Care | Payer: 59 | Attending: Family Medicine | Admitting: Family Medicine

## 2018-08-09 ENCOUNTER — Ambulatory Visit (INDEPENDENT_AMBULATORY_CARE_PROVIDER_SITE_OTHER): Payer: 59

## 2018-08-09 ENCOUNTER — Other Ambulatory Visit: Payer: Self-pay

## 2018-08-09 ENCOUNTER — Encounter (HOSPITAL_COMMUNITY): Payer: Self-pay | Admitting: Emergency Medicine

## 2018-08-09 DIAGNOSIS — S92512A Displaced fracture of proximal phalanx of left lesser toe(s), initial encounter for closed fracture: Secondary | ICD-10-CM | POA: Diagnosis not present

## 2018-08-09 DIAGNOSIS — W2209XA Striking against other stationary object, initial encounter: Secondary | ICD-10-CM

## 2018-08-09 MED ORDER — IBUPROFEN 800 MG PO TABS: 800.0000 mg | ORAL_TABLET | Freq: Three times a day (TID) | ORAL | 0 refills | Status: AC

## 2018-08-09 NOTE — Discharge Instructions (Signed)
Ice and elevation, especially once done for the day and over the next 48 hours to help with pain and swelling.  Ibuprofen regularly for pain control.  Use of post op shoe as provided as well as buddy tape of the toe for support.  Follow up with orthopedics in the next 1-2 weeks for further evaluation and management.

## 2018-08-09 NOTE — ED Provider Notes (Signed)
[place]    CSN: [id number] Arrival date & time: 08/09/18  0802     History   Chief Complaint Chief Complaint  Patient presents with  . Toe Injury    HPI Krista Foster is a 47 y.o. female.   Krista Foster presents with complaints of left 4th toe pain after she accidentally kicked an indoor train yesterday. No numbness or tingling but has had swelling and pain. Took ibuprofen yesterday which did help. Has not taken today. No ankle pain. Denies any previous foot or toe injury. No skin break or bleeding. Nail remains intact. Pain 10/10 with any touching or movement of the toes. Buddy taped toe which has helped some. Hx of anemia, migraines, htn.    ROS per HPI.      Past Medical History:  Diagnosis Date  . Anemia 2016  . Edentulous   . Headache    history of migraines  . Hypertension   . Shortness of breath dyspnea    smoker and anemic    Patient Active Problem List   Diagnosis Date Noted  . Vaginal itching 07/20/2018  . Hot flashes 07/20/2018  . Fatigue 09/05/2011  . DYSPNEA 10/25/2010  . TOBACCO USER 09/22/2009  . OVERWEIGHT 08/01/2008  . DEPRESSIVE DISORDER, NOS 02/11/2007  . MIGRAINE, UNSPEC., W/O INTRACTABLE MIGRAINE 02/11/2007  . HYPERTENSION, BENIGN SYSTEMIC 02/11/2007    Past Surgical History:  Procedure Laterality Date  . BILATERAL SALPINGECTOMY N/A 02/13/2015   Procedure: BILATERAL SALPINGECTOMY;  Surgeon: Donnamae Jude, MD;  Location: [facility];  Service: Gynecology;  Laterality: N/A;  . TUBAL LIGATION    . VAGINAL HYSTERECTOMY N/A 02/13/2015   Procedure: HYSTERECTOMY VAGINAL;  Surgeon: Donnamae Jude, MD;  Location: [facility];  Service: Gynecology;  Laterality: N/A;    OB History    Gravida  3   Para  3   Term      Preterm      AB      Living  3     SAB      TAB      Ectopic      Multiple      Live Births               Home Medications    Prior to Admission medications   Medication Sig Start Date End Date Taking?  Authorizing Provider  cetirizine-pseudoephedrine (ZYRTEC-D) 5-120 MG tablet Take 1 tablet by mouth daily. 10/09/17   Tasia Catchings, Amy V, PA-C  ibuprofen (ADVIL,MOTRIN) 800 MG tablet Take 1 tablet (800 mg total) by mouth 3 (three) times daily. 08/09/18   Augusto Gamble B, NP  lisinopril (ZESTRIL) 2.5 MG tablet Take 1 tablet (2.5 mg total) by mouth daily. 07/20/18   Donnamae Jude, MD    Family History Family History  Problem Relation Age of Onset  . Heart disease Father   . Stroke Father   . Diabetes Son     Social History Social History   Tobacco Use  . Smoking status: Current Every Day Smoker    Packs/day: 0.30    Years: 30.00    Pack years: 9.00    Types: Cigarettes  . Smokeless tobacco: Never Used  Substance Use Topics  . Alcohol use: No  . Drug use: No     Allergies   Patient has no known allergies.   Review of Systems Review of Systems   Physical Exam Triage Vital Signs ED Triage Vitals  Enc Vitals Group  BP 08/09/18 0818 (!) 153/91     Pulse Rate 08/09/18 0818 69     Resp 08/09/18 0818 16     Temp 08/09/18 0818 98.4 F (36.9 C)     Temp Source 08/09/18 0818 Oral     SpO2 08/09/18 0818 100 %     Weight --      Height --      Head Circumference --      Peak Flow --      Pain Score 08/09/18 0826 10     Pain Loc --      Pain Edu? --      Excl. in [place]? --    No data found.  Updated Vital Signs BP (!) 153/91 (BP Location: Left Arm)   Pulse 69   Temp 98.4 F (36.9 C) (Oral)   Resp 16   LMP 05/15/2018 (Approximate) Comment: Spotting  SpO2 100%   Visual Acuity Right Eye Distance:   Left Eye Distance:   Bilateral Distance:    Right Eye Near:   Left Eye Near:    Bilateral Near:     Physical Exam  Constitutional: She is oriented to person, place, and time. She appears well-developed and well-nourished. No distress.  Cardiovascular: Normal rate, regular rhythm and normal heart sounds.  Pulmonary/Chest: Effort normal and breath sounds normal.    Musculoskeletal:       Left ankle: Normal.       Left foot: There is decreased range of motion, tenderness, bony tenderness and swelling. There is normal capillary refill, no crepitus, no deformity and no laceration.       Feet:  Entire left toe with swelling and tenderness; tender at MTP joint of 3rd and 4th toes with mild bruising noted; strong pedal pulse; cap refill < 2 seconds  ; full ROM of ankle and all other toes; skin intact and nail intact   Neurological: She is alert and oriented to person, place, and time.  Skin: Skin is warm and dry.     UC Treatments / Results  Labs (all labs ordered are listed, but only abnormal results are displayed) Labs Reviewed - No data to display  EKG None  Radiology Dg Foot Complete Left  Result Date: 08/09/2018 CLINICAL DATA:  Traumatic left foot pain.  Initial encounter. EXAM: LEFT FOOT - COMPLETE 3+ VIEW COMPARISON:  None. FINDINGS: Oblique fracture through the shaft of the fourth proximal phalanx with 1 mm of displacement. On the lateral view there is apex plantar angulation. No dislocation. IMPRESSION: Mildly displaced fourth proximal phalanx shaft fracture. Electronically Signed   By: Monte Fantasia M.D.   On: 08/09/2018 08:51    Procedures Procedures (including critical care time)  Medications Ordered in UC Medications - No data to display  Initial Impression / Assessment and Plan / UC Course  I have reviewed the triage vital signs and the nursing notes.  Pertinent labs & imaging results that were available during my care of the patient were reviewed by me and considered in my medical decision making (see chart for details).      Proximal phalanx of left 4th toe with fracture, consistent with history and exam. Ice, elevation, buddy tape, post op shoe for pain control. Follow up with orthopedics for definitive plan of care. Patient verbalized understanding and agreeable to plan.  Ambulatory out of clinic without difficulty.      Final Clinical Impressions(s) / UC Diagnoses   Final diagnoses:  Closed displaced fracture of proximal phalanx  of lesser toe of left foot, initial encounter     Discharge Instructions     Ice and elevation, especially once done for the day and over the next 48 hours to help with pain and swelling.  Ibuprofen regularly for pain control.  Use of post op shoe as provided as well as buddy tape of the toe for support.  Follow up with orthopedics in the next 1-2 weeks for further evaluation and management.     ED Prescriptions    Medication Sig Dispense Auth. Provider   ibuprofen (ADVIL,MOTRIN) 800 MG tablet Take 1 tablet (800 mg total) by mouth 3 (three) times daily. 30 tablet Zigmund Gottron, NP     Controlled Substance Prescriptions [place] Controlled Substance Registry consulted? Not Applicable   Zigmund Gottron, NP 08/09/18 [phone]

## 2018-08-09 NOTE — ED Triage Notes (Signed)
Yesterday kicked a item in the floor.  Left foot is swollen and toe next to little toe

## 2018-08-13 ENCOUNTER — Other Ambulatory Visit: Payer: Self-pay | Admitting: Family Medicine

## 2018-08-13 ENCOUNTER — Ambulatory Visit: Payer: 59

## 2018-08-23 ENCOUNTER — Encounter: Payer: Self-pay | Admitting: Family Medicine

## 2018-08-23 ENCOUNTER — Ambulatory Visit: Payer: 59 | Admitting: Family Medicine

## 2018-08-23 DIAGNOSIS — Z09 Encounter for follow-up examination after completed treatment for conditions other than malignant neoplasm: Secondary | ICD-10-CM

## 2018-08-23 NOTE — Progress Notes (Signed)
Patient did not keep appointment today. She may call to reschedule.  

## 2018-09-27 ENCOUNTER — Ambulatory Visit: Payer: 59

## 2019-05-05 ENCOUNTER — Emergency Department
Admission: EM | Admit: 2019-05-05 | Discharge: 2019-05-05 | Disposition: A | Discharge status: Home / Self Care | Payer: Self-pay | Attending: Emergency Medicine | Admitting: Emergency Medicine

## 2019-05-05 DIAGNOSIS — R51 Headache: Secondary | ICD-10-CM | POA: Insufficient documentation

## 2019-05-05 DIAGNOSIS — R519 Headache, unspecified: Secondary | ICD-10-CM

## 2019-05-05 HISTORY — DX: Essential (primary) hypertension: I10

## 2019-05-05 HISTORY — DX: Migraine, unspecified, not intractable, without status migrainosus: G43.909

## 2019-05-05 MED ORDER — PROMETHAZINE HCL 25 MG PO TABS
25.0000 mg | ORAL_TABLET | Freq: Once | ORAL | Status: AC
Start: 2019-05-05 — End: 2019-05-05
  Administered 2019-05-05: 11:00:00 25 mg via ORAL

## 2019-05-05 MED ORDER — PROMETHAZINE HCL 25 MG PO TABS
ORAL_TABLET | ORAL | Status: AC
Start: 2019-05-05 — End: ?
  Filled 2019-05-05: qty 1

## 2019-05-05 MED ORDER — KETOROLAC TROMETHAMINE 30 MG/ML IJ SOLN
60.00 mg | Freq: Once | INTRAMUSCULAR | Status: AC
Start: 2019-05-05 — End: 2019-05-05
  Administered 2019-05-05: 11:00:00 60 mg via INTRAMUSCULAR

## 2019-05-05 MED ORDER — KETOROLAC TROMETHAMINE 30 MG/ML IJ SOLN
INTRAMUSCULAR | Status: AC
Start: 2019-05-05 — End: ?
  Filled 2019-05-05: qty 2

## 2019-05-05 NOTE — ED Notes (Signed)
Pt was given this list of family physicians.

## 2019-05-05 NOTE — ED Notes (Signed)
[facility] Local Physician List    Providers in bold participate in [facility] MyChart    Dr. Andrew Berens  [address]  [place], [place]  [phone]   Dr. Joseph Hashem  [address]  [place], [place]  [phone]   Brenda Hashem, Nurse Practitioner  [address]  [place]  [phone]   Dr. Amanda Michael  [address]  [place], [place]  [phone]   Allison Wade, Nurse Practitioner  [address]  [place], [place]  [phone]   Amanda Bauler, Nurse Practitioner  [address]  [place]  [phone]   Dr. Mathew Polk, Podiatry  [address]  [place], [place]  [phone]   Dr. Charles Hyre, Surgeon  [address]  [place], [place]  [phone]   Dr. Jeff Skiles, Cardiology  [address]  [place], [place]  [phone]   Dr. Joseph Hann, Orthopedics  [address]  [place], [place]  [phone]   Dr. Jeffrey Lessar, Pulmonology   [address]  [place], [place]  [phone] Dr. Karen Wade, Gynecology  [address]  [place], [place]  [phone]     Dr. James Gardiner, Gastroenterology  [address]  [place], [place]  [phone]      [facility]  [address]  [place], [place]  [phone]   [facility]  [address]  [place]  [phone]   [facility] - 8:30am to 4:30 pm Monday through Friday  [address]  [place]  [phone]  [phone]  Provides STD testing, Family planning, Blood pressure screening, Immunizations for school, Flu/Pneumonia shots  [facility] ([facility])     [facility] - Income based using sliding scale.  (Additional paperwork needs submitted).  [place] Office - [phone]  [place] Office - [phone]     [facility]   [phone]  [address]  [place]  Offers mental, dental and  family health.   Income based using a sliding scale.  (Additional paperwork needs submitted).

## 2019-05-05 NOTE — ED Provider Notes (Signed)
Physician/Midlevel provider first contact with patient: 05/05/19 1056         History     Chief Complaint   Patient presents with    Headache     48 year old female with a history of HTN and migraines who states that she has had approximately 4 migraines in the last few weeks.  She states she used to have migraines frequently and was on a little orange pill which she does not know the name of but after she had a hysterectomy her migraines decreased.  She states that recently however they have returned.  She states she just moved to this area in 10/2018 and has not established with a PCP.  She states that her headaches are bilateral and throbbing.  Morning the onset was associated with nausea vomiting which resolved.  She states that she took some Tylenol.  Headaches are similar to previous headaches.        Past Medical History:   Diagnosis Date    Hypertension     Migraine      Past Surgical History:   Procedure Laterality Date    HYSTERECTOMY      TUBAL LIGATION       No family history on file.    Social  Social History     Tobacco Use    Smoking status: Current Every Day Smoker     Packs/day: 1.00     Types: Cigarettes    Smokeless tobacco: Never Used   Substance Use Topics    Alcohol use: Never     Frequency: Never    Drug use: Never     No Known Allergies    Home Medications     Med List Status:  Complete Set By: Serita Butcher, RN at 05/05/2019 10:53 AM        No Medications         Review of Systems   Constitutional: Negative for chills, diaphoresis and fever.   HENT: Negative for rhinorrhea and sore throat.    Eyes: Negative for redness.   Respiratory: Negative for cough, shortness of breath and wheezing.    Cardiovascular: Negative for chest pain, palpitations and leg swelling.   Gastrointestinal: Negative for abdominal pain, constipation, diarrhea, nausea and vomiting.   Genitourinary: Negative for difficulty urinating, dysuria, frequency and urgency.   Musculoskeletal: Negative for arthralgias  and joint swelling.   Skin: Negative for rash.   Neurological: Positive for headaches. Negative for dizziness, tremors, seizures, syncope, facial asymmetry, speech difficulty, weakness, light-headedness and numbness.   Psychiatric/Behavioral: Negative for confusion and hallucinations.     Physical Exam    BP: (!) 153/100, Heart Rate: 70, Temp: 97.3 F (36.3 C), Resp Rate: 16, SpO2: 99 %, Weight: 80.7 kg    Physical Exam  Vitals signs and nursing note reviewed.   Constitutional:       General: She is not in acute distress.     Appearance: Normal appearance. She is not ill-appearing, toxic-appearing or diaphoretic.   HENT:      Head: Normocephalic and atraumatic.      Right Ear: Tympanic membrane and ear canal normal.      Left Ear: Tympanic membrane and ear canal normal.      Nose: Nose normal.      Mouth/Throat:      Mouth: Mucous membranes are moist.      Pharynx: No oropharyngeal exudate or posterior oropharyngeal erythema.   Eyes:      Extraocular Movements: Extraocular  movements intact.      Pupils: Pupils are equal, round, and reactive to light.   Neck:      Musculoskeletal: Normal range of motion. No neck rigidity or muscular tenderness.   Cardiovascular:      Rate and Rhythm: Normal rate and regular rhythm.      Heart sounds: No murmur.   Pulmonary:      Effort: No respiratory distress.      Breath sounds: Normal breath sounds.   Abdominal:      Palpations: Abdomen is soft.      Tenderness: There is no abdominal tenderness. There is no guarding or rebound.   Lymphadenopathy:      Cervical: No cervical adenopathy.   Skin:     General: Skin is warm and dry.   Neurological:      General: No focal deficit present.      Mental Status: She is alert and oriented to person, place, and time. Mental status is at baseline.      Cranial Nerves: No cranial nerve deficit.      Sensory: No sensory deficit.      Motor: No weakness.      Coordination: Coordination normal.      Gait: Gait normal.   Psychiatric:         Mood  and Affect: Mood normal.         Behavior: Behavior normal.       MDM and ED Course     ED Medication Orders (From admission, onward)    Start Ordered     Status Ordering Provider    05/05/19 1109 05/05/19 1108  ketorolac (TORADOL) injection 60 mg  Once in ED     Route: Intramuscular  Ordered Dose: 60 mg     Last MAR action:  Given Seniyah Esker GREGORY    05/05/19 1109 05/05/19 1108  promethazine (PHENERGAN) tablet 25 mg  Once in ED     Route: Oral  Ordered Dose: 25 mg     Last MAR action:  Given Dwon Sky GREGORY         MDM  Number of Diagnoses or Management Options  Diagnosis management comments: Patient who reports history of headaches with similar headache today.  Will give patient Toradol and Phenergan in the ED.  Recommend she establish with PCP so she can have her records transferred up regarding her headache work-up.  Patient also has history of hypertension which she is not currently taking medications for.  Recommend that she establish with PCP to begin antihypertensive therapy.  Return precautions given, patient states she understands and agrees with plan.    Procedures    Clinical Impression & Disposition     Clinical Impression  Final diagnoses:   Acute nonintractable headache, unspecified headache type      ED Disposition     ED Disposition Condition Date/Time Comment    Discharge  Thu May 05, 2019 11:15 AM Sherrilee Gilles discharge to home/self care.    Condition at disposition: Stable           New Prescriptions    No medications on file                 Retia Passe, MD  05/05/19 1116

## 2019-05-05 NOTE — ED Notes (Signed)
Pt was texting on her phone in the waiting room and in the room.

## 2019-05-05 NOTE — ED Triage Notes (Signed)
Pt states that she woke up at 0200 with the headache, did vomit.  Took ibuprofen at 0500 and took a couple of more prior to coming in.  States that she has blurred vision with both eyes.  States she has left sided headache.

## 2019-08-25 ENCOUNTER — Encounter (INDEPENDENT_AMBULATORY_CARE_PROVIDER_SITE_OTHER): Payer: Self-pay

## 2019-08-25 ENCOUNTER — Telehealth (INDEPENDENT_AMBULATORY_CARE_PROVIDER_SITE_OTHER): Payer: Self-pay | Admitting: Family

## 2019-08-25 DIAGNOSIS — Z0289 Encounter for other administrative examinations: Secondary | ICD-10-CM

## 2019-08-25 DIAGNOSIS — J029 Acute pharyngitis, unspecified: Secondary | ICD-10-CM

## 2019-08-25 NOTE — Progress Notes (Signed)
Subjective:    Patient ID: Crystal Herman is a 48 y.o. female.  The patient was evaluated using telehealth platform. Exam findings are visual observation and/or having patient perform various physical exam tasks under my guidance. Verbal consent for evaluation was obtained.     Patient presents today as a telehealth visit for sore throat. Reports while at work yesterday her throat began hurting. Denies fever. "having hot flashes". States she just recently moved here, last had BP medication in November - has not had since. "Think getting headaches due to high blood pressure and not having medicine since November". Checks BP at home occasionally - "normally around 158/96". Denies ill contacts or COVID exposure. 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     Sore Throat    This is a new problem. The current episode started yesterday. Neither side of throat is experiencing more pain than the other. There has been no fever. The pain is at a severity of 6/10. Associated symptoms include headaches (took aleeve which resolved headache. hx of headaches "30 years") and a hoarse voice. Pertinent negatives include no abdominal pain, congestion, coughing, diarrhea, drooling, ear discharge, ear pain, plugged ear sensation, neck pain, shortness of breath, stridor, swollen glands, trouble swallowing (painful) or vomiting. She has had exposure to strep (niece 2 days ago briefly). She has had no exposure to mono. She has tried NSAIDs for the symptoms.     Past Medical History:   Diagnosis Date   . Hypertension    . Migraine         Past Surgical History:   Procedure Laterality Date   . HYSTERECTOMY     . TUBAL LIGATION         History reviewed. No pertinent family history.    The following portions of the patient's history were reviewed and updated as appropriate: allergies, current medications, past family history, past medical history, past social history, past surgical history and problem list.    Review of Systems    Constitutional: Negative for activity change, appetite change, chills, diaphoresis, fatigue and fever.   HENT: Positive for hoarse voice, sore throat (pain 6/10) and voice change (hoarse, "lost voice"). Negative for congestion, drooling, ear discharge, ear pain, postnasal drip, rhinorrhea, sinus pressure, sinus pain and trouble swallowing (painful).    Eyes: Negative.    Respiratory: Negative for cough, chest tightness, shortness of breath, wheezing and stridor.         Current smoker denies asthma   Cardiovascular: Negative for chest pain.   Gastrointestinal: Negative for abdominal pain, diarrhea, nausea and vomiting.   Genitourinary: Negative.    Musculoskeletal: Negative for myalgias, neck pain and neck stiffness.   Allergic/Immunologic: Negative for environmental allergies.   Neurological: Positive for headaches (took aleeve which resolved headache. hx of headaches "30 years"). Negative for dizziness, syncope and light-headedness.         Objective:    There were no vitals taken for this visit.    Physical Exam  Vitals signs (Temp 97.8. PE limited d/t telehealth) and nursing note reviewed.   Constitutional:       General: She is not in acute distress.     Appearance: Normal appearance. She is not ill-appearing, toxic-appearing or diaphoretic.   HENT:      Head: Normocephalic and atraumatic. No right periorbital erythema or left periorbital erythema.      Ears:      Comments: No tenderness around ears     Nose: Nose normal. No  congestion or rhinorrhea.      Right Sinus: No maxillary sinus tenderness or frontal sinus tenderness.      Left Sinus: No maxillary sinus tenderness or frontal sinus tenderness.      Mouth/Throat:      Lips: Pink.      Mouth: Mucous membranes are moist.      Pharynx: Oropharynx is clear. Uvula midline. No pharyngeal swelling, oropharyngeal exudate, posterior oropharyngeal erythema or uvula swelling.      Tonsils: No tonsillar exudate.   Eyes:      General: Lids are normal.       Conjunctiva/sclera: Conjunctivae normal.   Neck:      Musculoskeletal: Normal range of motion and neck supple. No muscular tenderness.   Pulmonary:      Effort: Pulmonary effort is normal. No tachypnea, accessory muscle usage or respiratory distress.   Lymphadenopathy:      Cervical: No cervical adenopathy.   Skin:     Coloration: Skin is not pale.   Neurological:      General: No focal deficit present.      Mental Status: She is alert and oriented to person, place, and time.   Psychiatric:         Behavior: Behavior normal. Behavior is cooperative.               Assessment and Plan:       Shronda was seen today for sore throat.    Diagnoses and all orders for this visit:    Encounter to obtain excuse from work    Sore throat    -Discussed need for follow up with PCP asap, pt states she is waiting until insurance is active and will have appointment scheduled soon  -She is instructed to continue to check BP daily and keep log to bring to her PCP appt  -If symptomatic with high blood pressure instructed to be evaluated in ER  -Sore throat likely related to allergy  -Tylenol/ibuprofen prn for pain  -Chloraseptic throat spray for sore throat  -Drink plenty of fluids  -Try to decrease/avoid cigarette smoking as this will further irritate your throat  -Cool liquids and voice rest while having hoarseness  -Low risk COVID no obvious exposure and no other presenting symptoms  -If she develops fever, cough, SOB, body aches, is instructed to RTC for further evaluation  -Stay home if you are ill  -Work note provided  Go to the ER for any new or worsening symptoms that concern you.  Follow-up with your primary care doctor or return to Urgent Care if your symptoms do not improve.    Patient agrees with the plan.          Pincus Sanes, FNP  [facility]  08/25/2019  8:00 PM

## 2019-08-28 ENCOUNTER — Telehealth (INDEPENDENT_AMBULATORY_CARE_PROVIDER_SITE_OTHER): Payer: Self-pay

## 2019-08-28 NOTE — Telephone Encounter (Signed)
Spoke to patient as a courtesy call from recent visit. Patient states that they are feeling better and  had  no questions or concerns at this time.

## 2020-01-01 IMAGING — DX DG FOOT COMPLETE 3+V*L*
3 series · 3 of 3 positions shown · non-contrast
Comparison: None.

CLINICAL DATA: Traumatic left foot pain.  Initial encounter.

EXAM:
LEFT FOOT - COMPLETE 3+ VIEW

[foot ap]
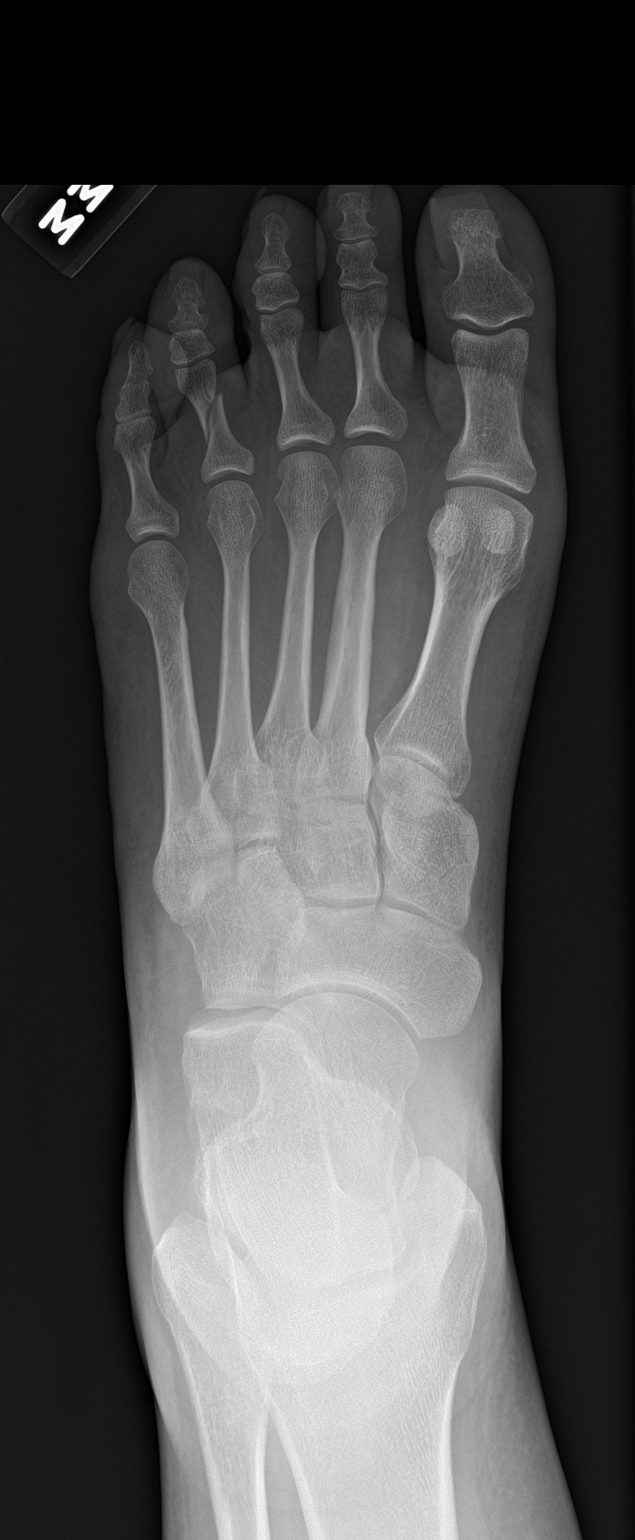

[foot obl]
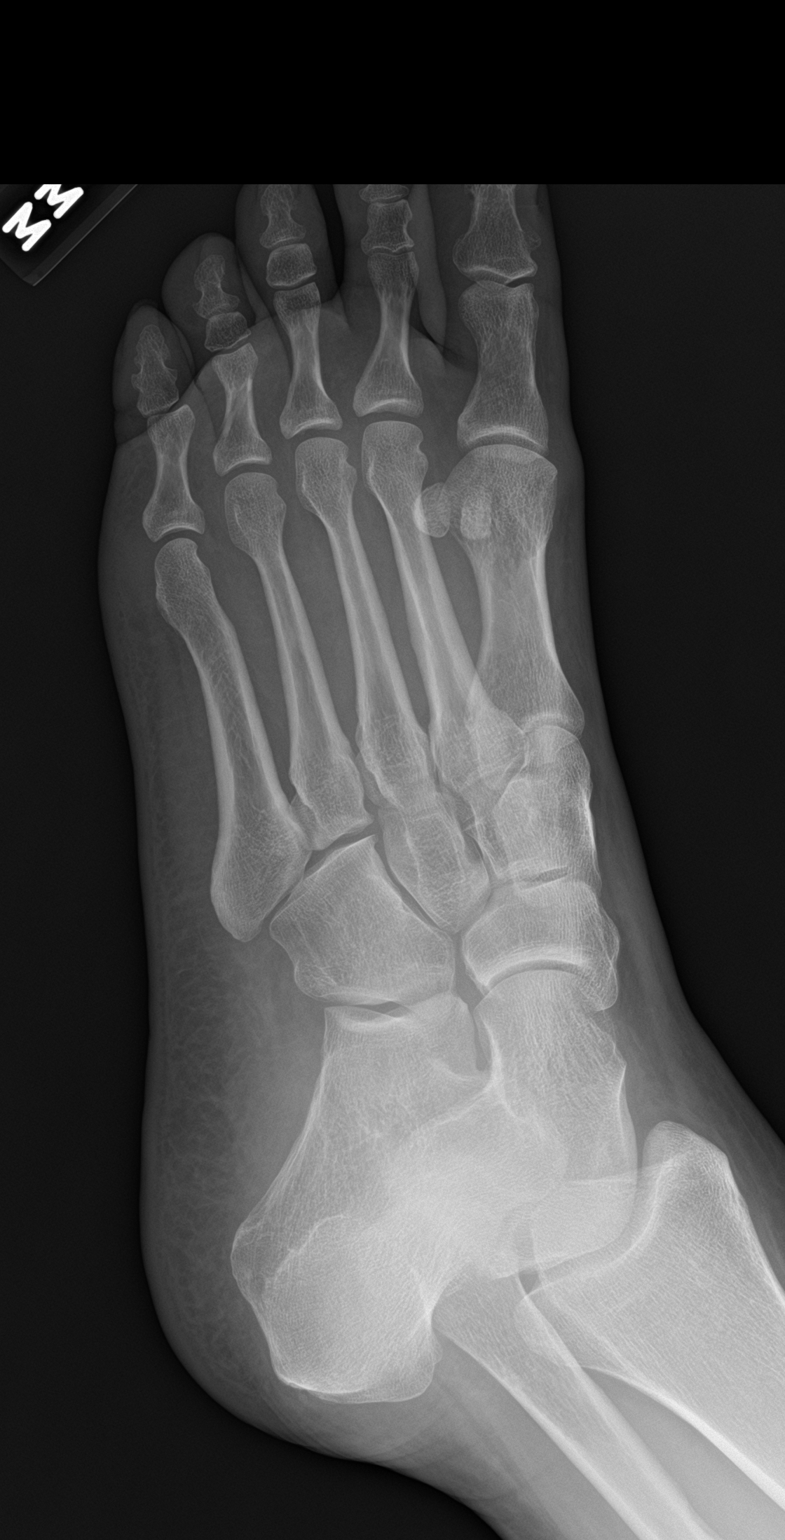

[foot lat]
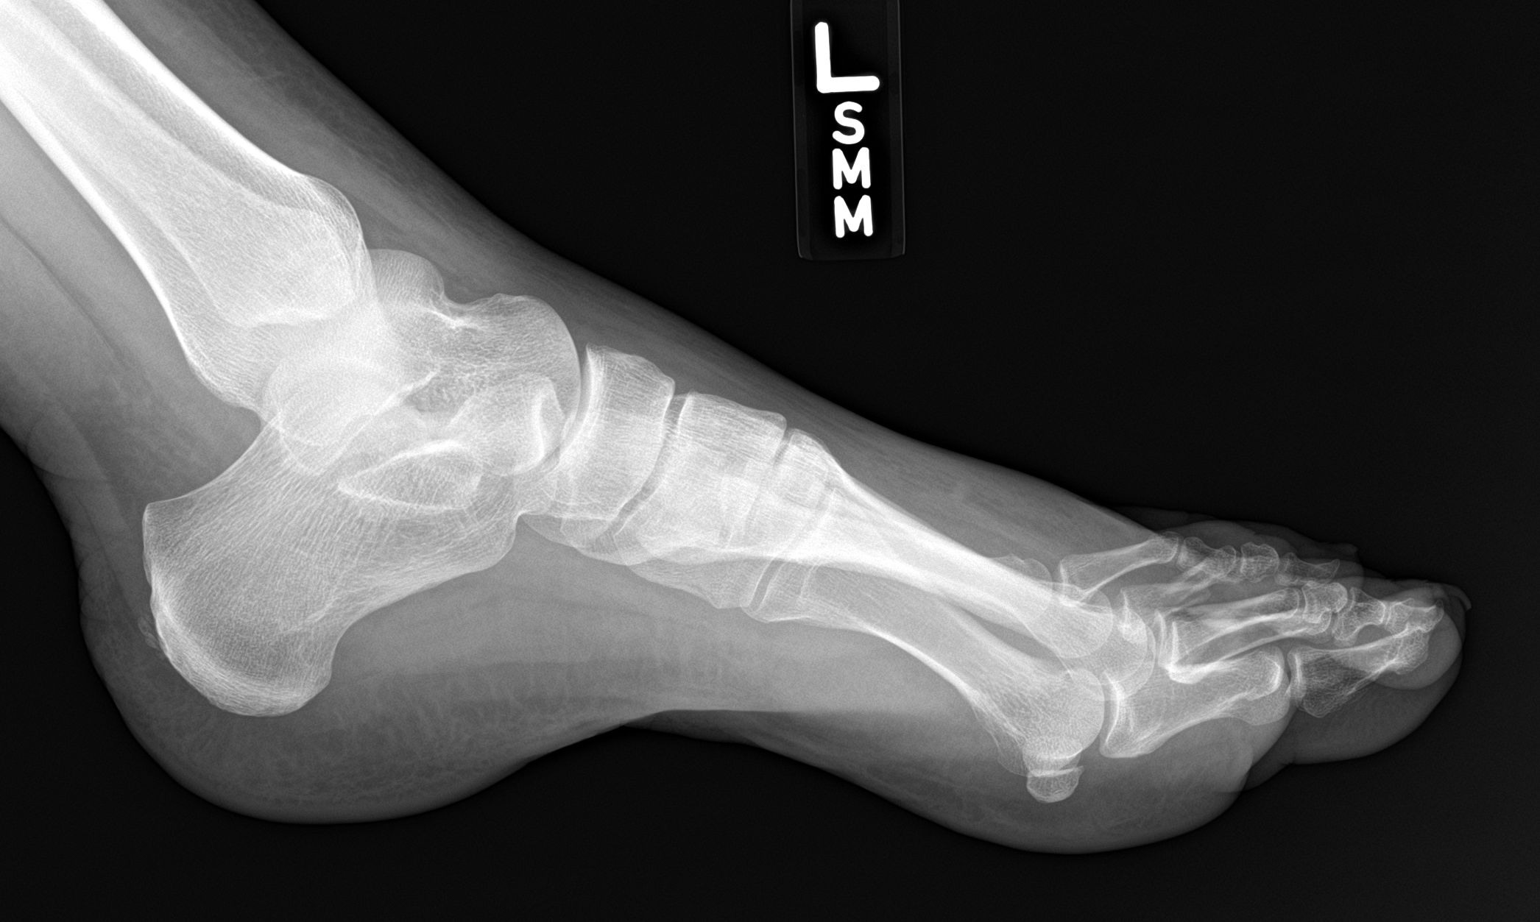

[3 of 3 positions shown; findings below may reference images not displayed]

FINDINGS: Oblique fracture through the shaft of the fourth proximal phalanx
with 1 mm of displacement. On the lateral view there is apex plantar
angulation. No dislocation.
IMPRESSION: Mildly displaced fourth proximal phalanx shaft fracture.

## 2020-04-25 ENCOUNTER — Encounter (INDEPENDENT_AMBULATORY_CARE_PROVIDER_SITE_OTHER): Payer: Self-pay | Admitting: FAMILY PRACTICE

## 2020-04-25 ENCOUNTER — Ambulatory Visit (INDEPENDENT_AMBULATORY_CARE_PROVIDER_SITE_OTHER): Payer: BC Managed Care – PPO | Admitting: FAMILY PRACTICE

## 2020-04-25 ENCOUNTER — Other Ambulatory Visit: Payer: Self-pay

## 2020-04-25 VITALS — BP 140/82 | HR 78 | Temp 98.5°F | Ht 70.0 in | Wt 178.0 lb

## 2020-04-25 DIAGNOSIS — H5789 Other specified disorders of eye and adnexa: Secondary | ICD-10-CM

## 2020-04-25 MED ORDER — ERYTHROMYCIN 5 MG/GRAM (0.5 %) EYE OINTMENT
TOPICAL_OINTMENT | OPHTHALMIC | 1 refills | Status: AC
Start: 2020-04-25 — End: ?

## 2020-04-25 NOTE — Nursing Note (Signed)
BP (!) 140/82   Pulse 78   Temp 36.9 C (98.5 F) (Tympanic)   Ht 1.778 m (5\' 10" )   Wt 80.7 kg (178 lb)   SpO2 98%   BMI 25.54 kg/m       , CMA  04/25/2020, 11:37

## 2020-04-25 NOTE — Progress Notes (Signed)
Subjective:     Patient ID:  Shirley Morrison is an 48 y.o. female   Chief Complaint:    Chief Complaint   Patient presents with   . Eye Irritation     x Right   . Eye Swelling     x Right   . Blurred Vision     x Right        Pt with sudden onset of red eye since yesterday. Pt concerned of possible pinkeye or eye irritation. No recall of direct injury or trauma. No fb sensation noted. occ blurred vision, and eye pain. No HA, no cough, no URI symptoms.    The history is provided by the patient.   Eye Problem  This is a new problem. The current episode started yesterday. The problem occurs daily. The problem has not changed since onset.Pertinent negatives include no headaches and no shortness of breath. Nothing aggravates the symptoms. Nothing relieves the symptoms. She has tried nothing for the symptoms.       Review of Systems   Constitutional: Negative for activity change, appetite change, chills and fever.   HENT: Negative for congestion, ear pain, rhinorrhea and sore throat.    Eyes: Positive for photophobia, pain, redness and visual disturbance. Negative for discharge and itching.   Respiratory: Negative for cough, shortness of breath and wheezing.    Skin: Negative for color change and rash.   Neurological: Negative for headaches.     Vitals:    04/25/20 1136   BP: (!) 140/82   Pulse: 78   Temp: 36.9 C (98.5 F)   TempSrc: Tympanic   SpO2: 98%   Weight: 80.7 kg (178 lb)   Height: 1.778 m (5\' 10" )   BMI: 25.59       Objective:   Physical Exam  Vitals reviewed.   Constitutional:       Appearance: Normal appearance.   HENT:      Head: Normocephalic and atraumatic.      Right Ear: External ear normal.      Left Ear: External ear normal.      Nose: No congestion.      Mouth/Throat:      Mouth: Mucous membranes are moist.   Eyes:      General: Lids are normal.      Extraocular Movements: Extraocular movements intact.      Conjunctiva/sclera:      Right eye: Right conjunctiva is injected. No exudate or hemorrhage.      Left eye: Left conjunctiva is not injected. No exudate or hemorrhage.     Pupils: Pupils are equal, round, and reactive to light.     Musculoskeletal:      Cervical back: Normal range of motion and neck supple.   Skin:     General: Skin is warm.   Neurological:      Mental Status: She is alert.           Assessment & Plan:       ICD-10-CM    1. Irritation of right eye  H57.89 POCT HEARING/VISION/TYMPANOGRAM (AMB ONLY)     erythromycin (ROMYCIN) 5 mg/gram (0.5 %) Ophthalmic Ointment     RETURN TO WORK/SCHOOL       Orders Placed This Encounter   . POCT HEARING/VISION/TYMPANOGRAM (AMB ONLY)   . erythromycin (ROMYCIN) 5 mg/gram (0.5 %) Ophthalmic Ointment     Erythromycin eye ointment as directed  Recommend follow-up with ophthalmologist in 24-48 hrs for further evaluation and management  if no signs of improvement   Plan of care reviewed with patient  ER if symptoms worsening  Questions addressed and answered    Primus Bravo, MD  04/25/2020, 11:52

## 2020-04-25 NOTE — Patient Instructions (Addendum)
Erythromycin eye ointment as directed  Recommend follow-up with ophthalmologist in 24-48 hrs for further evaluation and management if no signs of improvement   Plan of care reviewed with patient  ER if symptoms worsening  Questions addressed and answered    Primus Bravo, MD  04/25/2020, 11:52

## 2020-05-27 ENCOUNTER — Emergency Department
Admission: EM | Admit: 2020-05-27 | Discharge: 2020-05-27 | Disposition: A | Discharge status: Home / Self Care | Payer: BC Managed Care – PPO | Attending: Surgery | Admitting: Surgery

## 2020-05-27 DIAGNOSIS — H10213 Acute toxic conjunctivitis, bilateral: Secondary | ICD-10-CM | POA: Insufficient documentation

## 2020-05-27 MED ORDER — KETOTIFEN FUMARATE 0.025 % OP SOLN
1.00 [drp] | Freq: Two times a day (BID) | OPHTHALMIC | 0 refills | Status: AC
Start: 2020-05-27 — End: ?

## 2020-05-27 MED ORDER — ERYTHROMYCIN 5 MG/GM OP OINT
TOPICAL_OINTMENT | OPHTHALMIC | 0 refills | Status: AC
Start: 2020-05-27 — End: ?

## 2020-05-27 MED ORDER — ERYTHROMYCIN 5 MG/GM OP OINT
TOPICAL_OINTMENT | Freq: Once | OPHTHALMIC | Status: AC
Start: 2020-05-27 — End: 2020-05-27
  Administered 2020-05-27: 12:00:00 1 g via OPHTHALMIC

## 2020-05-27 MED ORDER — PROPARACAINE HCL 0.5 % OP SOLN
2.00 [drp] | Freq: Once | OPHTHALMIC | Status: AC
Start: 2020-05-27 — End: 2020-05-27
  Administered 2020-05-27: 12:00:00 2 [drp] via OPHTHALMIC

## 2020-05-27 MED ORDER — ERYTHROMYCIN 5 MG/GM OP OINT
TOPICAL_OINTMENT | OPHTHALMIC | Status: AC
Start: 2020-05-27 — End: ?
  Filled 2020-05-27: qty 1

## 2020-05-27 MED ORDER — PROPARACAINE HCL 0.5 % OP SOLN
OPHTHALMIC | Status: AC
Start: 2020-05-27 — End: ?
  Filled 2020-05-27: qty 15

## 2020-05-27 NOTE — Discharge Instructions (Signed)
Understanding NoninfectiousRed Eye: Treating Inflammation     Wrap a cold pack in a thin towel before using as a cold compress.     Red eyes are sometimes caused by viral or bacterial infections. But inflammation in one or both eyesoften happens because of allergies or environmental irritants. Here's a closer look at these two commoncauses of eye inflammation,and how to treat them.  Allergies  Do your eyes swell and itch when you pet a cat? Do they get red, watery, and itchy every spring or summer? If so, you may have an allergy to animals. Or you may have an allergy to a fine powder (pollen) made by certain plants. Along with dust and mold, animals and pollen are the most common causes of allergies. Often both eyes will get inflamed when you are around whatever causes your allergy.  Treating the symptoms:   The only cure for an allergy is to avoid the substance (allergen) that causes it.   Eye drops and cold compresses can help reduce swelling. They can ease redness and itching.   Symptoms often get better if you use allergy eye drops. Or if you limit your contact with the allergen.   If your allergy is severe, your healthcare provider may prescribe oral medicine. This may include antihistamines or steroids.   Your healthcare provider mayrefer you to a specialist.  Environmental irritants  Your eyes can also be irritated by things such as:   Air pollution   Smoke   Fumes   Some eye drops   Contact lenses, especially extended-wear lenses  These environmental irritants can make your eyes blood vessels swell. Your eyelids may get red and swollen. Your eyes may water. But they dont often itch as much as they do with allergies. Depending on the cause, one or both eyes may be inflamed.  Treating the symptoms:   Stay away fromthe irritant.   Try artificial tears to help flush out your eye. They lubricate your eyes surface.   Ask your healthcare provider about medicated anti-inflammatory or  anti-allergy eye drops. These can reduce swelling and ease redness.  [employer]Well last reviewed this educational content on 10/15/2016   2000-2020 [employer], [place]. [address], [place], [place] [address]. All rights reserved. This information is not intended as a substitute for professional medical care. Always follow your healthcare professional's instructions.          Conjunctivitis Caused by Irritation     Your healthcare provider may prescribe eye drops to help relieve symptoms.   Conjunctivitis may be caused by allergies to animals, chemicals, or other irritants. This includes eye drops. Mosteye dropscontain chemicals (preservatives) that may irritate youreyes. The problem can keep coming back. This can lead to aneye infection. Treatment involves relieving your symptoms and avoiding the irritant. If you have an infection, it will be treated.   Allergies  Grass, pollen, dust, mold, and animals are common causes of allergies. They can make your eyes red, watery, and itchy. In most cases, both eyes are affected.   Treatment  The best way to control an allergy is to avoid its source. Cold compresses and eye drops can help reduce the swelling. They can also help relieve redness and itching. If your allergy is severe, your healthcare provider may prescribe eye drops or oral medicines. Be certain to use the eye drops or take the medicine as prescribed. Symptoms may take several weeks to clear up.   Other irritants  Pollution, smoke, contact lenses, and  makeup can also irritate your eyes.Your eyes can get red, sore, puffy, and watery. One or both eyes may become irritated.   Treatment  The best thing to do is avoid the irritant. Artificial tears can help flush out the eye and lubricate the surface. Your healthcare provider may also prescribe eye drops to reduce swelling and relieve redness. Be certain to use the eye drops as prescribed. In some cases, you may have to stop wearing contact lenses or certain  types of eye makeup.   [employer] last reviewed this educational content on 03/16/2019   2000-2020 [employer], [place]. [address] [place], [place] [address]. All rights reserved. This information is not intended as a substitute for professional medical care. Always follow your healthcare professional's instructions.

## 2020-05-27 NOTE — ED Provider Notes (Signed)
EMERGENCY DEPARTMENT HISTORY AND PHYSICAL EXAM      Date Time: 05/27/20 12:17 PM  Patient Name: Crystal Herman  Attending Physician: Arbie Cookey, DO      Assessment/Plan:   IMPRESSION/DIFFERENTAL DX:   Final diagnoses:   Chemical conjunctivitis of both eyes     PLAN:   ED Disposition     ED Disposition Condition Date/Time Comment    Discharge  Sun May 27, 2020 12:16 PM Crystal Herman discharge to home/self care.    Condition at disposition: Stable    Follow up with Dr Wolfgang Phoenix, Opthomologist in [place]. Call for an appointment.        I have placed a call into Dr Lucio Edward office, and am awaiting a follow up call. (the pt will be discharged with an out pt follow up).    History of Presenting Illness:   Crystal Herman IS A 49 y.o. female who present with a hx of having conjunctivitis first diagnosed at 04/25/20. She was given erythromycin ointment. She then went to "[facility]" when it seemed to move in the left eye. They gave him neomycin/polymixin B/dexamethasome drops. She now has severe eye pain, burning and + photophobia. She was placing the new drops in both eyes and had now had issues.     Alcaine was somewhat effective in decreasing the pain but not completely.      Past Medical History:     Past Medical History:   Diagnosis Date    Hypertension     Migraine        Past Surgical History:     Past Surgical History:   Procedure Laterality Date    HYSTERECTOMY      TUBAL LIGATION         Family History:   History reviewed. No pertinent family history.    Social History:     Social History     Socioeconomic History    Marital status: Single     Spouse name: Not on file    Number of children: Not on file    Years of education: Not on file    Highest education level: Not on file   Occupational History    Not on file   Tobacco Use    Smoking status: Current Every Day Smoker     Packs/day: 1.00     Types: Cigarettes    Smokeless tobacco: Never Used   Vaping Use    Vaping  Use: Never used   Substance and Sexual Activity    Alcohol use: Never    Drug use: Never    Sexual activity: Not on file   Other Topics Concern    Not on file   Social History Narrative    Not on file     Social Determinants of Health     Financial Resource Strain:     Difficulty of Paying Living Expenses:    Food Insecurity:     Worried About Programme researcher, broadcasting/film/video in the Last Year:     Barista in the Last Year:    Transportation Needs:     Freight forwarder (Medical):     Lack of Transportation (Non-Medical):    Physical Activity:     Days of Exercise per Week:     Minutes of Exercise per Session:    Stress:     Feeling of Stress :    Social Connections:     Frequency of Communication with  Friends and Family:     Frequency of Social Gatherings with Friends and Family:     Attends Religious Services:     Active Member of Clubs or Organizations:     Attends Banker Meetings:     Marital Status:    Intimate Partner Violence:     Fear of Current or Ex-Partner:     Emotionally Abused:     Physically Abused:     Sexually Abused:        Allergies:   No Known Allergies    Medications:     Previous Medications    IBUPROFEN (ADVIL) 800 MG TABLET    ibuprofen 800 mg tablet    NEOMYCIN-POLYMYXIN-DEXAMETH (MAXITROL) 3.5-10000-0.1 SUSPENSION    PLACE 1 DROP INTO THE LEFT EYE FOUR TIMES DAILY FOR 5 DAYS        Review of Systems:   Constitutional: No fever or chills.  GI: No vomiting or diarrhea.  ENT: No ear pain.  Cardiovascular: No chest pain or palpitations.  Respiratory: No cough or shortness of breath.  Skin:No rash or skin lesions.  All other systems reviewed and negative except as above, pertinent findings in HPI.  EYES: she was seen at urgent care back in May 12 and it went to the other eye.    No issues with allergies before.     Physical Exam:   Constitutional:  Vitals signs reviewed, + uncomfortable but not ill-appearing  Eyes: + both eyes have + conjunctivitis, + red  tearing, + severe photophobia.  ENT:  Mouth normal to inspection  Neuro:  GCS 15, no focal motor deficits  Skin:  Warm, dry  Head:  Atraumatic, normocephalic  Resp/Chest:  No distress  Upper Extremity:  Inspection normal, no cyanosis  Lower Extremity:  Inspection normal, no cyanosis    -> the alcaine has helped.    Labs / Rads:     Results     ** No results found for the last 24 hours. **        No results found.      Labs and Radiological Studies Reviewed      Arbie Cookey, DO                           [place], [place]  05/27/20 1217

## 2024-05-03 ENCOUNTER — Encounter (INDEPENDENT_AMBULATORY_CARE_PROVIDER_SITE_OTHER): Payer: Self-pay
